# Patient Record
Sex: Male | Born: 1960
Health system: Southern US, Community
[De-identification: ages and names within clinical notes are randomized; demographics above are authoritative.]

## PROBLEM LIST (undated history)

## (undated) DIAGNOSIS — T7840XA Allergy, unspecified, initial encounter: Secondary | ICD-10-CM

## (undated) DIAGNOSIS — I10 Essential (primary) hypertension: Secondary | ICD-10-CM

## (undated) DIAGNOSIS — N200 Calculus of kidney: Secondary | ICD-10-CM

## (undated) HISTORY — DX: Allergy, unspecified, initial encounter: T78.40XA

## (undated) HISTORY — DX: Essential (primary) hypertension: I10

---

## 2005-10-31 ENCOUNTER — Emergency Department: Payer: Self-pay | Admitting: Internal Medicine

## 2005-10-31 ENCOUNTER — Other Ambulatory Visit: Payer: Self-pay

## 2011-05-22 ENCOUNTER — Other Ambulatory Visit: Payer: Self-pay | Admitting: Family Medicine

## 2011-05-22 ENCOUNTER — Other Ambulatory Visit: Payer: Self-pay | Admitting: Physician Assistant

## 2011-05-22 NOTE — Telephone Encounter (Signed)
done

## 2013-08-22 ENCOUNTER — Ambulatory Visit: Payer: Self-pay | Admitting: Emergency Medicine

## 2013-08-22 VITALS — BP 132/86 | HR 85 | Temp 97.4°F | Resp 16 | Ht 68.0 in | Wt 186.4 lb

## 2013-08-22 DIAGNOSIS — W57XXXA Bitten or stung by nonvenomous insect and other nonvenomous arthropods, initial encounter: Secondary | ICD-10-CM

## 2013-08-22 DIAGNOSIS — T148 Other injury of unspecified body region: Secondary | ICD-10-CM

## 2013-08-22 LAB — COMPREHENSIVE METABOLIC PANEL
ALBUMIN: 4.7 g/dL (ref 3.5–5.2)
ALK PHOS: 83 U/L (ref 39–117)
ALT: 18 U/L (ref 0–53)
AST: 21 U/L (ref 0–37)
BUN: 16 mg/dL (ref 6–23)
CO2: 26 mEq/L (ref 19–32)
Calcium: 9.5 mg/dL (ref 8.4–10.5)
Chloride: 104 mEq/L (ref 96–112)
Creat: 0.89 mg/dL (ref 0.50–1.35)
Glucose, Bld: 89 mg/dL (ref 70–99)
POTASSIUM: 4.6 meq/L (ref 3.5–5.3)
SODIUM: 139 meq/L (ref 135–145)
TOTAL PROTEIN: 6.9 g/dL (ref 6.0–8.3)
Total Bilirubin: 0.6 mg/dL (ref 0.2–1.2)

## 2013-08-22 LAB — POCT CBC
GRANULOCYTE PERCENT: 69.6 % (ref 37–80)
HCT, POC: 48.9 % (ref 43.5–53.7)
Hemoglobin: 16.6 g/dL (ref 14.1–18.1)
Lymph, poc: 1.6 (ref 0.6–3.4)
MCH, POC: 32 pg — AB (ref 27–31.2)
MCHC: 33.9 g/dL (ref 31.8–35.4)
MCV: 94.4 fL (ref 80–97)
MID (CBC): 0.4 (ref 0–0.9)
MPV: 7.5 fL (ref 0–99.8)
PLATELET COUNT, POC: 326 10*3/uL (ref 142–424)
POC Granulocyte: 4.7 (ref 2–6.9)
POC LYMPH PERCENT: 24.5 %L (ref 10–50)
POC MID %: 5.9 % (ref 0–12)
RBC: 5.18 M/uL (ref 4.69–6.13)
RDW, POC: 13.1 %
WBC: 6.7 10*3/uL (ref 4.6–10.2)

## 2013-08-22 MED ORDER — AMOXICILLIN 500 MG PO CAPS: ORAL_CAPSULE | ORAL | Status: DC

## 2013-08-22 NOTE — Progress Notes (Signed)
Subjective:  This chart was scribed for Theodore Greene,   by Stacy Gardner, [facility] Scribe. The patient was seen in room 5 and the patient's care was started at 11:50 AM.   Patient ID: Theodore Greene, male    DOB: 19-Nov-1960, 53 y.o.   MRN: [id number] Chief Complaint  Patient presents with  . Tick Removal    poss lime     HPI HPI Comments: Theodore Greene is a 53 y.o. male who arrives to the [facility] complaining of tick bite to his upper back that occurred two weeks ago. He had multiple tick bites. [place]  Pt removed the tick and brought it here today. Pt thinks the tick's head may still be lodged in his side.  He has heart palpations and reports this occurred the last time he contracted Lime Disease. Denies anxiety.  Pt has a rash to his upper back, joint pain, and overall not feeling well. Pt reports having nausea and vomiting with taking doxycycline.  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 He mentions when he scrapes his hands or arms he has "blood blisters". Pt wears gloves while working. His grandmother has similar skin issues. There are no active problems to display for this patient.  Past Medical History  Diagnosis Date  . Allergy   . Hypertension    History reviewed. No pertinent past surgical history. Allergies  Allergen Reactions  . Doxycycline    Prior to Admission medications   Not on File   History   Social History  . Marital Status: Married    Spouse Name: N/A    Number of Children: N/A  . Years of Education: N/A   Occupational History  . Not on file.   Social History Main Topics  . Smoking status: Never Smoker   . Smokeless tobacco: Not on file  . Alcohol Use: No  . Drug Use: No  . Sexual Activity: Not on file   Other Topics Concern  . Not on file   Social History Narrative  . No narrative on file    Review of Systems    Musculoskeletal: Positive for arthralgias.  Skin: Positive for color change, rash and wound.       Objective:   Physical Exam CONSTITUTIONAL: Well developed/well nourished HEAD: Normocephalic/atraumatic EYES: EOMI/PERRL ENMT: Mucous membranes moist NECK: supple no meningeal signs SPINE:entire spine nontender CV: S1/S2 noted, no murmurs/rubs/gallops noted LUNGS: Lungs are clear to auscultation bilaterally, no apparent distress ABDOMEN: soft, nontender, no rebound or guarding GU:no cva tenderness NEURO: Pt is awake/alert, moves all extremitiesx4 EXTREMITIES: pulses normal, full ROM SKIN: warm, color normal in the left flank area there are 3 separate bite-like areas. One has a retained 1 mm foreign body which was removed with a 20-gauge needle. There is 9 cm of surrounding redness to these bites PSYCH: no abnormalities of mood noted  No results found for this or any previous visit.  DIAGNOSTIC STUDIES: Oxygen Saturation is 98% on room air, normal by my interpretation.    COORDINATION OF CARE:  11:56 AM Discussed course of care with pt . Pt understands and agrees.  Results for orders placed in visit on 08/22/13  POCT CBC      Result Value Ref Range   WBC 6.7  4.6 - 10.2 K/uL   Lymph, poc 1.6  0.6 - 3.4   POC  LYMPH PERCENT 24.5  10 - 50 %L   MID (cbc) 0.4  0 - 0.9   POC MID % 5.9  0 - 12 %M   POC Granulocyte 4.7  2 - 6.9   Granulocyte percent 69.6  37 - 80 %G   RBC 5.18  4.69 - 6.13 M/uL   Hemoglobin 16.6  14.1 - 18.1 g/dL   HCT, POC 48.9  43.5 - 53.7 %   MCV 94.4  80 - 97 fL   MCH, POC 32.0 (*) 27 - 31.2 pg   MCHC 33.9  31.8 - 35.4 g/dL   RDW, POC 13.1     Platelet Count, POC 326  142 - 424 K/uL   MPV 7.5  0 - 99.8 fL          Assessment & Plan:  Patient here with suspected Lyme disease erythema chronicum migrans. Titers were done we'll treat with amoxicillin for 3 weeks he has had significant GI problems and rash when he took doxycycline in the past . The  patient has a history of lyme previously 4-5 years ago.I personally performed the services described in this documentation, which was scribed in my presence. The recorded information has been reviewed and is accurate.

## 2013-08-22 NOTE — Patient Instructions (Signed)
Lyme Disease  You may have been bitten by a tick and are to watch for the development of Lyme Disease. Lyme Disease is an infection that is caused by a bacteria The bacteria causing this disease is named Borreilia burgdorferi. If a tick is infected with this bacteria and then bites you, then Lyme Disease may occur. These ticks are carried by deer and rodents such as rabbits and mice and infest grassy as well as forested areas. Fortunately most tick bites do not cause Lyme Disease.   Lyme Disease is easier to prevent than to treat. First, covering your legs with clothing when walking in areas where ticks are possibly abundant will prevent their attachment because ticks tend to stay within inches of the ground. Second, using insecticides containing DEET can be applied on skin or clothing. Last, because it takes about 12 to 24 hours for the tick to transmit the disease after attachment to the human host, you should inspect your body for ticks twice a day when you are in areas where Lyme Disease is common. You must look thoroughly when searching for ticks. The Ixodes tick that carries Lyme Disease is very small. It is around the size of a sesame seed (picture of tick is not actual size). Removal is best done by grasping the tick by the head and pulling it out. Do not to squeeze the body of the tick. This could inject the infecting bacteria into the bite site. Wash the area of the bite with an antiseptic solution after removal.   Lyme Disease is a disease that may affect many body systems. Because of the small size of the biting tick, most people do not notice being bitten. The first sign of an infection is usually a round red rash that extends out from the center of the tick bite. The center of the lesion may be blood colored (hemorrhagic) or have tiny blisters (vesicular). Most lesions have bright red outer borders and partial central clearing. This rash may extend out many inches in diameter, and multiple lesions may  be present. Other symptoms such as fatigue, headaches, chills and fever, general achiness and swelling of lymph glands may also occur. If this first stage of the disease is left untreated, these symptoms may gradually resolve by themselves, or progressive symptoms may occur because of spread of infection to other areas of the body.   Follow up with your caregiver to have testing and treatment if you have a tick bite and you develop any of the above complaints. Your caregiver may recommend preventative (prophylactic) medications which kill bacteria (antibiotics). Once a diagnosis of Lyme Disease is made, antibiotic treatment is highly likely to cure the disease. Effective treatment of late stage Lyme Disease may require longer courses of antibiotic therapy.   MAKE SURE YOU:   · Understand these instructions.  · Will watch your condition.  · Will get help right away if you are not doing well or get worse.  Document Released: 06/21/2000 Document Revised: 06/07/2011 Document Reviewed: 08/23/2008  [employer]® Patient Information ©2014 [employer].

## 2013-08-23 LAB — ROCKY MTN SPOTTED FVR AB, IGM-BLOOD: ROCKY MTN SPOTTED FEVER, IGM: 0.3 IV

## 2013-08-23 LAB — B. BURGDORFI ANTIBODIES: B burgdorferi Ab IgG+IgM: 0.31 {ISR}

## 2015-08-05 ENCOUNTER — Ambulatory Visit (INDEPENDENT_AMBULATORY_CARE_PROVIDER_SITE_OTHER): Payer: Self-pay | Admitting: Emergency Medicine

## 2015-08-05 ENCOUNTER — Ambulatory Visit (INDEPENDENT_AMBULATORY_CARE_PROVIDER_SITE_OTHER): Payer: Self-pay

## 2015-08-05 VITALS — BP 168/102 | HR 92 | Temp 99.1°F | Ht 68.5 in | Wt 191.0 lb

## 2015-08-05 DIAGNOSIS — J0100 Acute maxillary sinusitis, unspecified: Secondary | ICD-10-CM

## 2015-08-05 DIAGNOSIS — IMO0001 Reserved for inherently not codable concepts without codable children: Secondary | ICD-10-CM

## 2015-08-05 DIAGNOSIS — R509 Fever, unspecified: Secondary | ICD-10-CM

## 2015-08-05 DIAGNOSIS — R042 Hemoptysis: Secondary | ICD-10-CM

## 2015-08-05 DIAGNOSIS — R030 Elevated blood-pressure reading, without diagnosis of hypertension: Secondary | ICD-10-CM

## 2015-08-05 LAB — POCT CBC
Granulocyte percent: 82.6 %G — AB (ref 37–80)
HCT, POC: 48.2 % (ref 43.5–53.7)
HEMOGLOBIN: 17.6 g/dL (ref 14.1–18.1)
LYMPH, POC: 1.3 (ref 0.6–3.4)
MCH, POC: 32.2 pg — AB (ref 27–31.2)
MCHC: 36.6 g/dL — AB (ref 31.8–35.4)
MCV: 88 fL (ref 80–97)
MID (cbc): 0.7 (ref 0–0.9)
MPV: 6.3 fL (ref 0–99.8)
POC Granulocyte: 9.7 — AB (ref 2–6.9)
POC LYMPH PERCENT: 11.5 %L (ref 10–50)
POC MID %: 5.9 %M (ref 0–12)
Platelet Count, POC: 278 10*3/uL (ref 142–424)
RBC: 5.48 M/uL (ref 4.69–6.13)
RDW, POC: 12.7 %
WBC: 11.7 10*3/uL — AB (ref 4.6–10.2)

## 2015-08-05 LAB — COMPLETE METABOLIC PANEL WITH GFR
ALBUMIN: 4.5 g/dL (ref 3.6–5.1)
ALK PHOS: 115 U/L (ref 40–115)
ALT: 16 U/L (ref 9–46)
AST: 18 U/L (ref 10–35)
BUN: 13 mg/dL (ref 7–25)
CO2: 25 mmol/L (ref 20–31)
CREATININE: 0.98 mg/dL (ref 0.70–1.33)
Calcium: 9.2 mg/dL (ref 8.6–10.3)
Chloride: 103 mmol/L (ref 98–110)
GFR, Est African American: >89 mL/min (ref >=60)
GFR, Est Non African American: 87 mL/min (ref >=60)
GLUCOSE: 82 mg/dL (ref 65–99)
POTASSIUM: 4.3 mmol/L (ref 3.5–5.3)
SODIUM: 139 mmol/L (ref 135–146)
TOTAL PROTEIN: 7.3 g/dL (ref 6.1–8.1)
Total Bilirubin: 0.9 mg/dL (ref 0.2–1.2)

## 2015-08-05 MED ORDER — AMOXICILLIN-POT CLAVULANATE 875-125 MG PO TABS: 1.0000 | ORAL_TABLET | Freq: Two times a day (BID) | ORAL | Status: DC

## 2015-08-05 MED ORDER — FLUTICASONE PROPIONATE 50 MCG/ACT NA SUSP: 2.0000 | Freq: Every day | NASAL | Status: DC

## 2015-08-05 MED ORDER — AMLODIPINE BESYLATE 5 MG PO TABS: 5.0000 mg | ORAL_TABLET | Freq: Every day | ORAL | Status: DC

## 2015-08-05 NOTE — Progress Notes (Addendum)
By signing my name below, I, Theodore Greene, attest that this documentation has been prepared under the direction and in the presence of Theodore Queen, MD.  Electronically Signed: Verlee Greene, Medical Scribe. 08/05/2015. 11:28 AM.  Chief Complaint:  Chief Complaint  Patient presents with  . Sinusitis    HPI: Theodore Greene is a 55 y.o. male who reports to [facility] today complaining of worsening sneezing and productive coughing with green, white and red sputum that began 2 weeks ago. Pt reports having a fever of 101 last night. Pts wife reports intermittent conjunctival icterus. [name] mentioned he could have a sick contact when his son picked up a viral infection 2 weeks ago. Pt reports smoking a pack a day, and has been smoking since he was 55 y/o.   Pt mentions hx of kidney stones.  Past Medical History  Diagnosis Date  . Allergy   . Hypertension    History reviewed. No pertinent past surgical history. Social History   Social History  . Marital Status: Married    Spouse Name: N/A  . Number of Children: N/A  . Years of Education: N/A   Social History Main Topics  . Smoking status: Current Some Day Smoker  . Smokeless tobacco: None  . Alcohol Use: No  . Drug Use: No  . Sexual Activity: Not Asked   Other Topics Concern  . None   Social History Narrative   Family History  Problem Relation Age of Onset  . Cancer Father    Allergies  Allergen Reactions  . Doxycycline    Prior to Admission medications   Medication Sig Start Date End Date Taking? Authorizing Provider  amoxicillin (AMOXIL) 500 MG capsule Take 1 pill  3 times a day for 3 weeks Patient not taking: Reported on 08/05/2015 08/22/13   Theodore Russian, MD     ROS: The patient denies, chills, night sweats, unintentional weight loss, chest pain, palpitations, wheezing, dyspnea on exertion, nausea, vomiting, abdominal pain, dysuria, hematuria, melena, numbness, weakness, or tingling.  All other systems have  been reviewed and were otherwise negative with the exception of those mentioned in the HPI and as above.    PHYSICAL EXAM: Filed Vitals:   08/05/15 1036  BP: 168/102  Pulse: 92  Temp: 99.1 F (37.3 C)   Body mass index is 28.62 kg/(m^2).   General: Alert. Pt appears ill but no distress HEENT:  Normocephalic, atraumatic, oropharynx patent. Eye: EOMI, [facility]. Some puffiness around eye. Hint of scleral icterus. Cardiovascular:  Regular rate and rhythm, no rubs murmurs or gallops.  No Carotid bruits, radial pulse intact. No pedal edema.  Respiratory: Clear to auscultation bilaterally.  No wheezes, rales, or rhonchi.  No cyanosis, no use of accessory musculature Abdominal: No organomegaly, abdomen is soft and non-tender, positive bowel sounds.  No masses. Musculoskeletal: Gait intact. No edema, tenderness Skin: No rashes. Neurologic: Facial musculature symmetric. Psychiatric: Patient acts appropriately throughout our interaction. Lymphatic: No cervical or submandibular lymphadenopathy  LABS: Results for orders placed or performed in visit on 08/05/15  POCT CBC  Result Value Ref Range   WBC 11.7 (A) 4.6 - 10.2 K/uL   Lymph, poc 1.3 0.6 - 3.4   POC LYMPH PERCENT 11.5 10 - 50 %L   MID (cbc) 0.7 0 - 0.9   POC MID % 5.9 0 - 12 %M   POC Granulocyte 9.7 (A) 2 - 6.9   Granulocyte percent 82.6 (A) 37 - 80 %G   RBC  5.48 4.69 - 6.13 M/uL   Hemoglobin 17.6 14.1 - 18.1 g/dL   HCT, POC 48.2 43.5 - 53.7 %   MCV 88.0 80 - 97 fL   MCH, POC 32.2 (A) 27 - 31.2 pg   MCHC 36.6 (A) 31.8 - 35.4 g/dL   RDW, POC 12.7 %   Platelet Count, POC 278 142 - 424 K/uL   MPV 6.3 0 - 99.8 fL   EKG/XRAY:   Primary read interpreted by Dr. Everlene Farrier at [facility].  Dg Sinus 1-2 Views  08/05/2015  CLINICAL DATA:  Sinus pressure and drainage EXAM: PARANASAL SINUSES - 1-2 VIEW COMPARISON:  None. FINDINGS: A single Waters view of the paranasal sinuses shows a rounded opacity in the floor of the right maxillary sinus most  consistent with retention cyst. No definite mucosal thickening or air-fluid level is seen on the single Waters view obtained. No bony abnormality is noted. IMPRESSION: Probable retention cyst in the floor of the right maxillary sinus. Consider CT maxillary sinus study if further assessment is warranted. Electronically Signed   By: Theodore Greene M.D.   On: 08/05/2015 12:08   Dg Chest 2 View  08/05/2015  CLINICAL DATA:  Worsening productive cough and sneezing.  Fever. EXAM: CHEST  2 VIEW COMPARISON:  None. FINDINGS: Normal sized heart. Clear lungs. Mild central peribronchial thickening. Unremarkable bones. IMPRESSION: Mild bronchitic changes. Electronically Signed   By: Theodore Greene M.D.   On: 08/05/2015 12:08    ASSESSMENT/PLAN: Patient showed evidence of bronchitis as well as a rounded opacity in the floor of the right maxillary sinus. Patient is private pay. We'll plan recheck in 1-2 weeks and decision made at that time regarding CT of the maxillary sinus. Patient will be treated with Augmentin twice a day. He will be on Mucinex twice a day. He was started on Norvasc 5 mg daily for blood pressure. He was encouraged to stop smoking. He was advised he needed to follow-up in 1-2 weeks regarding his blood pressure.I personally performed the services described in this documentation, which was scribed in my presence. The recorded information has been reviewed and is accurate.   Gross sideeffects, risk and benefits, and alternatives of medications d/w patient. Patient is aware that all medications have potential sideeffects and we are unable to predict every sideeffect or drug-drug interaction that may occur.  Theodore Queen MD 08/05/2015 11:27 AM

## 2015-08-05 NOTE — Patient Instructions (Addendum)
IF you received an x-ray today, you will receive an invoice from [facility]. Please contact [facility] at [phone] with questions or concerns regarding your invoice.   IF you received labwork today, you will receive an invoice from [employer]. Please contact [facility] at [phone] with questions or concerns regarding your invoice.   Our billing staff will not be able to assist you with questions regarding bills from these companies.  You will be contacted with the lab results as soon as they are available. The fastest way to get your results is to activate your My Chart account. Instructions are located on the last page of this paperwork. If you have not heard from [place] regarding the results in 2 weeks, please contact this office.    DASH Eating Plan DASH stands for "Dietary Approaches to Stop Hypertension." The DASH eating plan is a healthy eating plan that has been shown to reduce high blood pressure (hypertension). Additional health benefits may include reducing the risk of type 2 diabetes mellitus, heart disease, and stroke. The DASH eating plan may also help with weight loss. WHAT DO I NEED TO KNOW ABOUT THE DASH EATING PLAN? For the DASH eating plan, you will follow these general guidelines:  Choose foods with a percent daily value for sodium of less than 5% (as listed on the food label).  Use salt-free seasonings or herbs instead of table salt or sea salt.  Check with your health care provider or pharmacist before using salt substitutes.  Eat lower-sodium products, often labeled as "lower sodium" or "no salt added."  Eat fresh foods.  Eat more vegetables, fruits, and low-fat dairy products.  Choose whole grains. Look for the word "whole" as the first word in the ingredient list.  Choose fish and skinless chicken or [place] more often than red meat. Limit fish, poultry, and meat to 6 oz (170 g) each day.  Limit sweets,  desserts, sugars, and sugary drinks.  Choose heart-healthy fats.  Limit cheese to 1 oz (28 g) per day.  Eat more home-cooked food and less restaurant, buffet, and fast food.  Limit fried foods.  Cook foods using methods other than frying.  Limit canned vegetables. If you do use them, rinse them well to decrease the sodium.  When eating at a restaurant, ask that your food be prepared with less salt, or no salt if possible. WHAT FOODS CAN I EAT? Seek help from a dietitian for individual calorie needs. Grains Whole grain or whole wheat bread. Brown rice. Whole grain or whole wheat pasta. Quinoa, bulgur, and whole grain cereals. Low-sodium cereals. Corn or whole wheat flour tortillas. Whole grain cornbread. Whole grain crackers. Low-sodium crackers. Vegetables Fresh or frozen vegetables (raw, steamed, roasted, or grilled). Low-sodium or reduced-sodium tomato and vegetable juices. Low-sodium or reduced-sodium tomato sauce and paste. Low-sodium or reduced-sodium canned vegetables.  Fruits All fresh, canned (in natural juice), or frozen fruits. Meat and Other Protein Products Ground beef (85% or leaner), grass-fed beef, or beef trimmed of fat. Skinless chicken or [place]. Ground chicken or [place]. Pork trimmed of fat. All fish and seafood. Eggs. Dried beans, peas, or lentils. Unsalted nuts and seeds. Unsalted canned beans. Dairy Low-fat dairy products, such as skim or 1% milk, 2% or reduced-fat cheeses, low-fat ricotta or cottage cheese, or plain low-fat yogurt. Low-sodium or reduced-sodium cheeses. Fats and Oils Tub margarines without trans fats. Light or reduced-fat mayonnaise and salad dressings (reduced sodium). Avocado. Safflower, olive, or canola oils.  Natural peanut or almond butter. Other Unsalted popcorn and pretzels. The items listed above may not be a complete list of recommended foods or beverages. Contact your dietitian for more options. WHAT FOODS ARE NOT  RECOMMENDED? Grains White bread. White pasta. White rice. Refined cornbread. Bagels and croissants. Crackers that contain trans fat. Vegetables Creamed or fried vegetables. Vegetables in a cheese sauce. Regular canned vegetables. Regular canned tomato sauce and paste. Regular tomato and vegetable juices. Fruits Dried fruits. Canned fruit in light or heavy syrup. Fruit juice. Meat and Other Protein Products Fatty cuts of meat. Ribs, chicken wings, bacon, sausage, bologna, salami, chitterlings, fatback, hot dogs, bratwurst, and packaged luncheon meats. Salted nuts and seeds. Canned beans with salt. Dairy Whole or 2% milk, cream, half-and-half, and cream cheese. Whole-fat or sweetened yogurt. Full-fat cheeses or blue cheese. Nondairy creamers and whipped toppings. Processed cheese, cheese spreads, or cheese curds. Condiments Onion and garlic salt, seasoned salt, table salt, and sea salt. Canned and packaged gravies. Worcestershire sauce. Tartar sauce. Barbecue sauce. Teriyaki sauce. Soy sauce, including reduced sodium. Steak sauce. Fish sauce. Oyster sauce. Cocktail sauce. Horseradish. Ketchup and mustard. Meat flavorings and tenderizers. Bouillon cubes. Hot sauce. Tabasco sauce. Marinades. Taco seasonings. Relishes. Fats and Oils Butter, stick margarine, lard, shortening, ghee, and bacon fat. Coconut, palm kernel, or palm oils. Regular salad dressings. Other Pickles and olives. Salted popcorn and pretzels. The items listed above may not be a complete list of foods and beverages to avoid. Contact your dietitian for more information. WHERE CAN I FIND MORE INFORMATION? National Heart, Lung, and Blood Institute: travelstabloid.com   This information is not intended to replace advice given to you by your health care provider. Make sure you discuss any questions you have with your health care provider.   Document Released: 03/04/2011 Document Revised: 04/05/2014  Document Reviewed: 01/17/2013 Elsevier Interactive Patient Education 2016 [employer]. Smoking Cessation, Tips for Success If you are ready to quit smoking, congratulations! You have chosen to help yourself be healthier. Cigarettes bring nicotine, tar, carbon monoxide, and other irritants into your body. Your lungs, heart, and blood vessels will be able to work better without these poisons. There are many different ways to quit smoking. Nicotine gum, nicotine patches, a nicotine inhaler, or nicotine nasal spray can help with physical craving. Hypnosis, support groups, and medicines help break the habit of smoking. WHAT THINGS CAN I DO TO MAKE QUITTING EASIER?  Here are some tips to help you quit for good:  Pick a date when you will quit smoking completely. Tell all of your friends and family about your plan to quit on that date.  Do not try to slowly cut down on the number of cigarettes you are smoking. Pick a quit date and quit smoking completely starting on that day.  Throw away all cigarettes.   Clean and remove all ashtrays from your home, work, and car.  On a card, write down your reasons for quitting. Carry the card with you and read it when you get the urge to smoke.  Cleanse your body of nicotine. Drink enough water and fluids to keep your urine clear or pale yellow. Do this after quitting to flush the nicotine from your body.  Learn to predict your moods. Do not let a bad situation be your excuse to have a cigarette. Some situations in your life might tempt you into wanting a cigarette.  Never have "just one" cigarette. It leads to wanting another and another. Remind yourself of your  decision to quit.  Change habits associated with smoking. If you smoked while driving or when feeling stressed, try other activities to replace smoking. Stand up when drinking your coffee. Brush your teeth after eating. Sit in a different chair when you read the paper. Avoid alcohol while trying to  quit, and try to drink fewer caffeinated beverages. Alcohol and caffeine may urge you to smoke.  Avoid foods and drinks that can trigger a desire to smoke, such as sugary or spicy foods and alcohol.  Ask people who smoke not to smoke around you.  Have something planned to do right after eating or having a cup of coffee. For example, plan to take a walk or exercise.  Try a relaxation exercise to calm you down and decrease your stress. Remember, you may be tense and nervous for the first 2 weeks after you quit, but this will pass.  Find new activities to keep your hands busy. Play with a pen, coin, or rubber band. Doodle or draw things on paper.  Brush your teeth right after eating. This will help cut down on the craving for the taste of tobacco after meals. You can also try mouthwash.   Use oral substitutes in place of cigarettes. Try using lemon drops, carrots, cinnamon sticks, or chewing gum. Keep them handy so they are available when you have the urge to smoke.  When you have the urge to smoke, try deep breathing.  Designate your home as a nonsmoking area.  If you are a heavy smoker, ask your health care provider about a prescription for nicotine chewing gum. It can ease your withdrawal from nicotine.  Reward yourself. Set aside the cigarette money you save and buy yourself something nice.  Look for support from others. Join a support group or smoking cessation program. Ask someone at home or at work to help you with your plan to quit smoking.  Always ask yourself, "Do I need this cigarette or is this just a reflex?" Tell yourself, "Today, I choose not to smoke," or "I do not want to smoke." You are reminding yourself of your decision to quit.  Do not replace cigarette smoking with electronic cigarettes (commonly called e-cigarettes). The safety of e-cigarettes is unknown, and some may contain harmful chemicals.  If you relapse, do not give up! Plan ahead and think about what you  will do the next time you get the urge to smoke. HOW WILL I FEEL WHEN I QUIT SMOKING? You may have symptoms of withdrawal because your body is used to nicotine (the addictive substance in cigarettes). You may crave cigarettes, be irritable, feel very hungry, cough often, get headaches, or have difficulty concentrating. The withdrawal symptoms are only temporary. They are strongest when you first quit but will go away within 10-14 days. When withdrawal symptoms occur, stay in control. Think about your reasons for quitting. Remind yourself that these are signs that your body is healing and getting used to being without cigarettes. Remember that withdrawal symptoms are easier to treat than the major diseases that smoking can cause.  Even after the withdrawal is over, expect periodic urges to smoke. However, these cravings are generally short lived and will go away whether you smoke or not. Do not smoke! WHAT RESOURCES ARE AVAILABLE TO HELP ME QUIT SMOKING? Your health care provider can direct you to community resources or hospitals for support, which may include:  Group support.  Education.  Hypnosis.  Therapy.   This information is not intended to  replace advice given to you by your health care provider. Make sure you discuss any questions you have with your health care provider.   Document Released: 12/12/2003 Document Revised: 04/05/2014 Document Reviewed: 08/31/2012 Elsevier Interactive Patient Education 2016 Elsevier Inc. Sinusitis, Adult Sinusitis is redness, soreness, and inflammation of the paranasal sinuses. Paranasal sinuses are air pockets within the bones of your face. They are located beneath your eyes, in the middle of your forehead, and above your eyes. In healthy paranasal sinuses, mucus is able to drain out, and air is able to circulate through them by way of your nose. However, when your paranasal sinuses are inflamed, mucus and air can become trapped. This can allow bacteria and  other germs to grow and cause infection. Sinusitis can develop quickly and last only a short time (acute) or continue over a long period (chronic). Sinusitis that lasts for more than 12 weeks is considered chronic. CAUSES Causes of sinusitis include:  Allergies.  Structural abnormalities, such as displacement of the cartilage that separates your nostrils (deviated septum), which can decrease the air flow through your nose and sinuses and affect sinus drainage.  Functional abnormalities, such as when the small hairs (cilia) that line your sinuses and help remove mucus do not work properly or are not present. SIGNS AND SYMPTOMS Symptoms of acute and chronic sinusitis are the same. The primary symptoms are pain and pressure around the affected sinuses. Other symptoms include:  Upper toothache.  Earache.  Headache.  Bad breath.  Decreased sense of smell and taste.  A cough, which worsens when you are lying flat.  Fatigue.  Fever.  Thick drainage from your nose, which often is green and may contain pus (purulent).  Swelling and warmth over the affected sinuses. DIAGNOSIS Your health care provider will perform a physical exam. During your exam, your health care provider may perform any of the following to help determine if you have acute sinusitis or chronic sinusitis:  Look in your nose for signs of abnormal growths in your nostrils (nasal polyps).  Tap over the affected sinus to check for signs of infection.  View the inside of your sinuses using an imaging device that has a light attached (endoscope). If your health care provider suspects that you have chronic sinusitis, one or more of the following tests may be recommended:  Allergy tests.  Nasal culture. A sample of mucus is taken from your nose, sent to a lab, and screened for bacteria.  Nasal cytology. A sample of mucus is taken from your nose and examined by your health care provider to determine if your sinusitis is  related to an allergy. TREATMENT Most cases of acute sinusitis are related to a viral infection and will resolve on their own within 10 days. Sometimes, medicines are prescribed to help relieve symptoms of both acute and chronic sinusitis. These may include pain medicines, decongestants, nasal steroid sprays, or saline sprays. However, for sinusitis related to a bacterial infection, your health care provider will prescribe antibiotic medicines. These are medicines that will help kill the bacteria causing the infection. Rarely, sinusitis is caused by a fungal infection. In these cases, your health care provider will prescribe antifungal medicine. For some cases of chronic sinusitis, surgery is needed. Generally, these are cases in which sinusitis recurs more than 3 times per year, despite other treatments. HOME CARE INSTRUCTIONS  Drink plenty of water. Water helps thin the mucus so your sinuses can drain more easily.  Use a humidifier.  Inhale steam  3-4 times a day (for example, sit in the bathroom with the shower running).  Apply a warm, moist washcloth to your face 3-4 times a day, or as directed by your health care provider.  Use saline nasal sprays to help moisten and clean your sinuses.  Take medicines only as directed by your health care provider.  If you were prescribed either an antibiotic or antifungal medicine, finish it all even if you start to feel better. SEEK IMMEDIATE MEDICAL CARE IF:  You have increasing pain or severe headaches.  You have nausea, vomiting, or drowsiness.  You have swelling around your face.  You have vision problems.  You have a stiff neck.  You have difficulty breathing.   This information is not intended to replace advice given to you by your health care provider. Make sure you discuss any questions you have with your health care provider.   Document Released: 03/15/2005 Document Revised: 04/05/2014 Document Reviewed: 03/30/2011 Elsevier  Interactive Patient Education 2016 Elsevier Inc. Acute Bronchitis Bronchitis is inflammation of the airways that extend from the windpipe into the lungs (bronchi). The inflammation often causes mucus to develop. This leads to a cough, which is the most common symptom of bronchitis.  In acute bronchitis, the condition usually develops suddenly and goes away over time, usually in a couple weeks. Smoking, allergies, and asthma can make bronchitis worse. Repeated episodes of bronchitis may cause further lung problems.  CAUSES Acute bronchitis is most often caused by the same virus that causes a cold. The virus can spread from person to person (contagious) through coughing, sneezing, and touching contaminated objects. SIGNS AND SYMPTOMS   Cough.   Fever.   Coughing up mucus.   Body aches.   Chest congestion.   Chills.   Shortness of breath.   Sore throat.  DIAGNOSIS  Acute bronchitis is usually diagnosed through a physical exam. Your health care provider will also ask you questions about your medical history. Tests, such as chest X-rays, are sometimes done to rule out other conditions.  TREATMENT  Acute bronchitis usually goes away in a couple weeks. Oftentimes, no medical treatment is necessary. Medicines are sometimes given for relief of fever or cough. Antibiotic medicines are usually not needed but may be prescribed in certain situations. In some cases, an inhaler may be recommended to help reduce shortness of breath and control the cough. A cool mist vaporizer may also be used to help thin bronchial secretions and make it easier to clear the chest.  HOME CARE INSTRUCTIONS  Get plenty of rest.   Drink enough fluids to keep your urine clear or pale yellow (unless you have a medical condition that requires fluid restriction). Increasing fluids may help thin your respiratory secretions (sputum) and reduce chest congestion, and it will prevent dehydration.   Take medicines only  as directed by your health care provider.  If you were prescribed an antibiotic medicine, finish it all even if you start to feel better.  Avoid smoking and secondhand smoke. Exposure to cigarette smoke or irritating chemicals will make bronchitis worse. If you are a smoker, consider using nicotine gum or skin patches to help control withdrawal symptoms. Quitting smoking will help your lungs heal faster.   Reduce the chances of another bout of acute bronchitis by washing your hands frequently, avoiding people with cold symptoms, and trying not to touch your hands to your mouth, nose, or eyes.   Keep all follow-up visits as directed by your health care provider.  SEEK MEDICAL CARE IF: Your symptoms do not improve after 1 week of treatment.  SEEK IMMEDIATE MEDICAL CARE IF:  You develop an increased fever or chills.   You have chest pain.   You have severe shortness of breath.  You have bloody sputum.   You develop dehydration.  You faint or repeatedly feel like you are going to pass out.  You develop repeated vomiting.  You develop a severe headache. MAKE SURE YOU:   Understand these instructions.  Will watch your condition.  Will get help right away if you are not doing well or get worse.   This information is not intended to replace advice given to you by your health care provider. Make sure you discuss any questions you have with your health care provider.   Document Released: 04/22/2004 Document Revised: 04/05/2014 Document Reviewed: 09/05/2012 Elsevier Interactive Patient Education [employer].

## 2015-10-20 ENCOUNTER — Emergency Department (HOSPITAL_COMMUNITY): Payer: Self-pay

## 2015-10-20 ENCOUNTER — Encounter (HOSPITAL_COMMUNITY): Payer: Self-pay | Admitting: Emergency Medicine

## 2015-10-20 ENCOUNTER — Inpatient Hospital Stay (HOSPITAL_COMMUNITY)
Admission: EM | Admit: 2015-10-20 | Discharge: 2015-10-27 | DRG: 392 | Disposition: A | Discharge status: Home / Self Care | Payer: Self-pay | Attending: General Surgery | Admitting: General Surgery

## 2015-10-20 DIAGNOSIS — K5720 Diverticulitis of large intestine with perforation and abscess without bleeding: Principal | ICD-10-CM | POA: Diagnosis present

## 2015-10-20 DIAGNOSIS — I10 Essential (primary) hypertension: Secondary | ICD-10-CM | POA: Diagnosis present

## 2015-10-20 DIAGNOSIS — Z809 Family history of malignant neoplasm, unspecified: Secondary | ICD-10-CM

## 2015-10-20 DIAGNOSIS — K5792 Diverticulitis of intestine, part unspecified, without perforation or abscess without bleeding: Secondary | ICD-10-CM | POA: Diagnosis present

## 2015-10-20 DIAGNOSIS — Z881 Allergy status to other antibiotic agents status: Secondary | ICD-10-CM

## 2015-10-20 DIAGNOSIS — F17200 Nicotine dependence, unspecified, uncomplicated: Secondary | ICD-10-CM | POA: Diagnosis present

## 2015-10-20 DIAGNOSIS — Z79899 Other long term (current) drug therapy: Secondary | ICD-10-CM

## 2015-10-20 DIAGNOSIS — K5700 Diverticulitis of small intestine with perforation and abscess without bleeding: Secondary | ICD-10-CM

## 2015-10-20 DIAGNOSIS — Z7951 Long term (current) use of inhaled steroids: Secondary | ICD-10-CM

## 2015-10-20 DIAGNOSIS — K5780 Diverticulitis of intestine, part unspecified, with perforation and abscess without bleeding: Secondary | ICD-10-CM

## 2015-10-20 HISTORY — DX: Calculus of kidney: N20.0

## 2015-10-20 LAB — CBC
HCT: 51.6 % (ref 39.0–52.0)
HEMOGLOBIN: 17.6 g/dL — AB (ref 13.0–17.0)
MCH: 30.9 pg (ref 26.0–34.0)
MCHC: 34.1 g/dL (ref 30.0–36.0)
MCV: 90.5 fL (ref 78.0–100.0)
PLATELETS: 274 10*3/uL (ref 150–400)
RBC: 5.7 MIL/uL (ref 4.22–5.81)
RDW: 12.4 % (ref 11.5–15.5)
WBC: 16.6 10*3/uL — ABNORMAL HIGH (ref 4.0–10.5)

## 2015-10-20 LAB — URINALYSIS, ROUTINE W REFLEX MICROSCOPIC
Glucose, UA: NEGATIVE mg/dL
KETONES UR: 40 mg/dL — AB
Leukocytes, UA: NEGATIVE
NITRITE: NEGATIVE
PROTEIN: 30 mg/dL — AB
Specific Gravity, Urine: 1.023 (ref 1.005–1.030)
pH: 5.5 (ref 5.0–8.0)

## 2015-10-20 LAB — COMPREHENSIVE METABOLIC PANEL
ALBUMIN: 4.7 g/dL (ref 3.5–5.0)
ALK PHOS: 102 U/L (ref 38–126)
ALT: 15 U/L — AB (ref 17–63)
ANION GAP: 10 (ref 5–15)
AST: 18 U/L (ref 15–41)
BILIRUBIN TOTAL: 2.2 mg/dL — AB (ref 0.3–1.2)
BUN: 12 mg/dL (ref 6–20)
CALCIUM: 9.4 mg/dL (ref 8.9–10.3)
CO2: 25 mmol/L (ref 22–32)
CREATININE: 1 mg/dL (ref 0.61–1.24)
Chloride: 101 mmol/L (ref 101–111)
GFR calc Af Amer: >60 mL/min (ref >60)
GFR calc non Af Amer: >60 mL/min (ref >60)
GLUCOSE: 94 mg/dL (ref 65–99)
Potassium: 3.8 mmol/L (ref 3.5–5.1)
Sodium: 136 mmol/L (ref 135–145)
TOTAL PROTEIN: 8.5 g/dL — AB (ref 6.5–8.1)

## 2015-10-20 LAB — URINE MICROSCOPIC-ADD ON

## 2015-10-20 LAB — DIFFERENTIAL
BASOS ABS: 0 10*3/uL (ref 0.0–0.1)
BASOS PCT: 0 %
EOS ABS: 0 10*3/uL (ref 0.0–0.7)
EOS PCT: 0 %
Lymphocytes Relative: 9 %
Lymphs Abs: 1.6 10*3/uL (ref 0.7–4.0)
Monocytes Absolute: 1 10*3/uL (ref 0.1–1.0)
Monocytes Relative: 6 %
NEUTROS PCT: 85 %
Neutro Abs: 14.6 10*3/uL — ABNORMAL HIGH (ref 1.7–7.7)

## 2015-10-20 LAB — LIPASE, BLOOD: Lipase: 17 U/L (ref 11–51)

## 2015-10-20 MED ORDER — SODIUM CHLORIDE 0.9 % IV SOLN
Freq: Once | INTRAVENOUS | Status: AC
  Administered 2015-10-20: 23:00:00 via INTRAVENOUS

## 2015-10-20 MED ORDER — FENTANYL CITRATE (PF) 100 MCG/2ML IJ SOLN
100.0000 ug | Freq: Once | INTRAMUSCULAR | Status: AC
  Administered 2015-10-20: 100 ug via INTRAVENOUS
  Filled 2015-10-20: qty 2

## 2015-10-20 MED ORDER — IOPAMIDOL (ISOVUE-300) INJECTION 61%
100.0000 mL | Freq: Once | INTRAVENOUS | Status: AC | PRN
  Administered 2015-10-20: 100 mL via INTRAVENOUS

## 2015-10-20 MED ORDER — ONDANSETRON HCL 4 MG/2ML IJ SOLN
4.0000 mg | Freq: Once | INTRAMUSCULAR | Status: AC
  Administered 2015-10-20: 4 mg via INTRAVENOUS
  Filled 2015-10-20: qty 2

## 2015-10-20 NOTE — ED Triage Notes (Signed)
Pt c/o LLQ abdominal pain since yesterday with intermittent fever and nausea. Pt also c/o urinary retention but sts he's been able to go the past couple of hours. A&Ox4 and ambulatory. Denies flank pain, burning with urination.

## 2015-10-20 NOTE — ED Notes (Signed)
MD at bedside. 

## 2015-10-20 NOTE — ED Provider Notes (Addendum)
[facility] DEPT Provider Note   CSN: [id number] Arrival date & time: 10/20/15  1626  First Provider Contact: 11:07 PM   By signing my name below, I, Jasmyn B. Alexander, attest that this documentation has been prepared under the direction and in the presence of Shanon Rosser, MD.  Electronically Signed: Tedra Coupe. Sheppard Coil, ED Scribe. 10/20/15. 11:16 PM.     History   Chief Complaint Chief Complaint  Patient presents with  . Abdominal Pain    HPI HPI Comments: Theodore Greene is a 55 y.o. male who presents to the Emergency Department complaining of gradual onset, moderate to severe LLQ abdominal pain x 1 day. Pain is described as dull with a sharp component. It is worse with movement or palpation or attempted bowel movement.  He has associated constipation and fever (TMAX 102.3). Pt has taken Liquid Motrin and Excedrin for fever with some relief. Per wife, he has also received a 100mg  of Colace. He has a history of diverticulitis. He has had nausea but no vomiting.  The history is provided by the patient and the spouse. No language interpreter was used.   Past Medical History:  Diagnosis Date  . Allergy   . Hypertension     There are no active problems to display for this patient.   History reviewed. No pertinent surgical history.   Home Medications    Prior to Admission medications   Medication Sig Start Date End Date Taking? Authorizing Provider  amLODipine (NORVASC) 5 MG tablet Take 1 tablet (5 mg total) by mouth daily. 08/05/15  Yes Darlyne Russian, MD  aspirin-acetaminophen-caffeine (EXCEDRIN MIGRAINE) [phone] MG tablet Take 1 tablet by mouth every 6 (six) hours as needed for headache.   Yes Historical Provider, MD  docusate sodium (COLACE) 100 MG capsule Take 100 mg by mouth daily as needed for mild constipation.   Yes Historical Provider, MD  fluticasone (FLONASE) 50 MCG/ACT nasal spray Place 2 sprays into both nostrils daily. 08/05/15  Yes Darlyne Russian, MD    ibuprofen (ADVIL,MOTRIN) 200 MG tablet Take 400 mg by mouth every 6 (six) hours as needed for moderate pain.   Yes Historical Provider, MD  amoxicillin (AMOXIL) 500 MG capsule Take 1 pill  3 times a day for 3 weeks Patient not taking: Reported on 08/05/2015 08/22/13   Darlyne Russian, MD  amoxicillin-clavulanate (AUGMENTIN) 875-125 MG tablet Take 1 tablet by mouth 2 (two) times daily. Patient not taking: Reported on 10/20/2015 08/05/15   Darlyne Russian, MD    Family History Family History  Problem Relation Age of Onset  . Cancer Father     Social History Social History  Substance Use Topics  . Smoking status: Current Some Day Smoker  . Smokeless tobacco: Not on file  . Alcohol use No     Allergies   Doxycycline   Review of Systems Review of Systems  Physical Exam Updated Vital Signs BP 152/94 (BP Location: Right Arm)   Pulse 100   Temp 98.1 F (36.7 C) (Oral)   Resp 19   Ht 5\' 8"  (1.727 m)   Wt 186 lb (84.4 kg)   SpO2 97%   BMI 28.28 kg/m   Physical Exam General: Well-developed, well-nourished male in no acute distress; appearance consistent with age of record HENT: normocephalic; atraumatic Eyes: pupils equal, round and reactive to light; extraocular muscles intact Neck: supple Heart: regular rate and rhythm Lungs: clear to auscultation bilaterally Abdomen: soft; mildly distended; LLQ tenderness; no masses or  hepatosplenomegaly; bowel sounds hypoactive Extremities: No deformity; full range of motion; pulses normal Neurologic: Awake, alert and oriented; motor function intact in all extremities and symmetric; no facial droop Skin: Warm and dry Psychiatric: Normal mood and affect  ED Treatments / Results   Nursing notes and vitals signs, including pulse oximetry, reviewed.  Summary of this visit's results, reviewed by myself:  Labs:  Results for orders placed or performed during the hospital encounter of 10/20/15 (from the past 24 hour(s))  Urinalysis, Routine w  reflex microscopic     Status: Abnormal   Collection Time: 10/20/15  5:17 PM  Result Value Ref Range   Color, Urine ORANGE (A) YELLOW   APPearance CLEAR CLEAR   Specific Gravity, Urine 1.023 1.005 - 1.030   pH 5.5 5.0 - 8.0   Glucose, UA NEGATIVE NEGATIVE mg/dL   Hgb urine dipstick MODERATE (A) NEGATIVE   Bilirubin Urine SMALL (A) NEGATIVE   Ketones, ur 40 (A) NEGATIVE mg/dL   Protein, ur 30 (A) NEGATIVE mg/dL   Nitrite NEGATIVE NEGATIVE   Leukocytes, UA NEGATIVE NEGATIVE  Urine microscopic-add on     Status: Abnormal   Collection Time: 10/20/15  5:17 PM  Result Value Ref Range   Squamous Epithelial / LPF 0-5 (A) NONE SEEN   WBC, UA 0-5 0 - 5 WBC/hpf   RBC / HPF 6-30 0 - 5 RBC/hpf   Bacteria, UA RARE (A) NONE SEEN   Casts HYALINE CASTS (A) NEGATIVE   Urine-Other MUCOUS PRESENT   Lipase, blood     Status: None   Collection Time: 10/20/15  5:57 PM  Result Value Ref Range   Lipase 17 11 - 51 U/L  Comprehensive metabolic panel     Status: Abnormal   Collection Time: 10/20/15  5:57 PM  Result Value Ref Range   Sodium 136 135 - 145 mmol/L   Potassium 3.8 3.5 - 5.1 mmol/L   Chloride 101 101 - 111 mmol/L   CO2 25 22 - 32 mmol/L   Glucose, Bld 94 65 - 99 mg/dL   BUN 12 6 - 20 mg/dL   Creatinine, Ser 1.00 0.61 - 1.24 mg/dL   Calcium 9.4 8.9 - 10.3 mg/dL   Total Protein 8.5 (H) 6.5 - 8.1 g/dL   Albumin 4.7 3.5 - 5.0 g/dL   AST 18 15 - 41 U/L   ALT 15 (L) 17 - 63 U/L   Alkaline Phosphatase 102 38 - 126 U/L   Total Bilirubin 2.2 (H) 0.3 - 1.2 mg/dL   GFR calc non Af Amer >60 >60 mL/min   GFR calc Af Amer >60 >60 mL/min   Anion gap 10 5 - 15  CBC     Status: Abnormal   Collection Time: 10/20/15  5:57 PM  Result Value Ref Range   WBC 16.6 (H) 4.0 - 10.5 K/uL   RBC 5.70 4.22 - 5.81 MIL/uL   Hemoglobin 17.6 (H) 13.0 - 17.0 g/dL   HCT 51.6 39.0 - 52.0 %   MCV 90.5 78.0 - 100.0 fL   MCH 30.9 26.0 - 34.0 pg   MCHC 34.1 30.0 - 36.0 g/dL   RDW 12.4 11.5 - 15.5 %   Platelets 274  150 - 400 K/uL  Differential     Status: Abnormal   Collection Time: 10/20/15  5:57 PM  Result Value Ref Range   Neutrophils Relative % 85 %   Neutro Abs 14.6 (H) 1.7 - 7.7 K/uL   Lymphocytes Relative 9 %  Lymphs Abs 1.6 0.7 - 4.0 K/uL   Monocytes Relative 6 %   Monocytes Absolute 1.0 0.1 - 1.0 K/uL   Eosinophils Relative 0 %   Eosinophils Absolute 0.0 0.0 - 0.7 K/uL   Basophils Relative 0 %   Basophils Absolute 0.0 0.0 - 0.1 K/uL    Imaging Studies: Ct Abdomen Pelvis W Contrast  Result Date: 10/21/2015 CLINICAL DATA:  Left lower quadrant abdominal pain. Intermittent fever and nausea. History of hypertension and renal stones. EXAM: CT ABDOMEN AND PELVIS WITH CONTRAST TECHNIQUE: Multidetector CT imaging of the abdomen and pelvis was performed using the standard protocol following bolus administration of intravenous contrast. CONTRAST:  111mL ISOVUE-300 IOPAMIDOL (ISOVUE-300) INJECTION 61% COMPARISON:  None. FINDINGS: Lower chest: Limited visualization of the lower thorax is negative for focal airspace opacity or pleural effusion. Normal heart size.  No pericardial effusion. Hepatobiliary: Normal hepatic contour. Punctate (approximately 0.8 cm) hypo attenuating lesion within the dome of the left lobe of the liver (image 17, series 2) as well as an approximately 3 mm hypo attenuating lesion within the caudal aspect of the lateral segment of the left lobe of the liver (image 22, series 2) are both too small to accurately characterize though favored to represent hepatic cysts. Normal appearance of the gallbladder given degree distention. No radiopaque gallstones. No intra or extrahepatic biliary duct dilatation. No ascites. Pancreas: Normal appearance of the pancreas Spleen:  Normal appearance of the spleen Adrenals/Urinary Tract: There is symmetric enhancement and excretion of the bilateral kidneys. There are 2 punctate nonobstructing stones within the inferior pole of the left kidney with dominant  stone measuring 0.9 cm in greatest short axis diameter. No evidence of right-sided nephrolithiasis. No discrete renal lesions. No urinary obstruction. Normal appearance of the bilateral adrenal glands. Normal appearance of the urinary bladder given degree distention Stomach/Bowel: Extensive colonic diverticulosis with short segment wall thickening involving the sigmoid colon within the midline of the lower pelvis. This finding is associated with adjacent ill-defined mesenteric stranding and scattered foci of adjacent pneumoperitoneum which extend along the retroperitoneum to the level of the left upper abdominal quadrant and adjacent to the spleen. In spite of this enteric perforation, there are no definable/drainable fluid collections within the abdomen or pelvis. The cecum is noted to be located within the right mid hemi abdomen. Normal appearance of the terminal ileum and appendix. No pneumatosis or portal venous gas. Vascular/Lymphatic: Moderate amount of eccentric mixed calcified and noncalcified atherosclerotic plaque within a normal caliber abdominal aorta. The major branch vessels of the abdominal aorta appear patent on this non CTA examination. No bulky retroperitoneal, mesenteric, pelvic or inguinal lymphadenopathy. Reproductive: Normal appearance of the prostate gland. No free fluid in the pelvic cul-de-sac. Other: Small bilateral mesenteric fat containing indirect inguinal hernias Musculoskeletal: No acute or aggressive osseous abnormalities. An intraosseous hemangioma is noted within the L4 vertebral body IMPRESSION: 1. Findings compatible with acute perforated sigmoid diverticulitis with small to moderate amount of pneumoperitoneum extending along the retroperitoneum to the level of the left upper abdominal quadrant adjacent to the spleen. In spite of this enteric perforation, there is no definable/drainable fluid collection. No evidence of enteric obstruction. 2. Nonobstructing left-sided  nephrolithiasis. 3.  Aortic Atherosclerosis (ICD10-170.0) Critical Value/emergent results were called by telephone at the time of interpretation on 10/21/2015 at 12:34 am to Dr. Shanon Rosser , who verbally acknowledged these results. Electronically Signed   By: Sandi Mariscal M.D.   On: 10/21/2015 00:37  12:48 AM Zosyn ordered for acute  diverticulitis. Patient now complaining of gross hematuria.  3:33 AM Dr. Marcello Moores of General Surgery will admit patient.  Procedures (including critical care time)  Medications Ordered in ED Medications  piperacillin-tazobactam (ZOSYN) IVPB 3.375 g (not administered)  HYDROmorphone (DILAUDID) injection 1 mg (not administered)  0.9 %  sodium chloride infusion ( Intravenous New Bag/Given 10/20/15 2329)  ondansetron (ZOFRAN) injection 4 mg (4 mg Intravenous Given 10/20/15 2328)  fentaNYL (SUBLIMAZE) injection 100 mcg (100 mcg Intravenous Given 10/20/15 2328)  iopamidol (ISOVUE-300) 61 % injection 100 mL (100 mLs Intravenous Contrast Given 10/20/15 2353)    Final Clinical Impressions(s) / ED Diagnoses   Final diagnoses:  Diverticulitis of small intestine with perforation without bleeding    I personally performed the services described in this documentation, which was scribed in my presence. The recorded information has been reviewed and is accurate.    Shanon Rosser, MD 10/21/15 [id number]    Shanon Rosser, MD 10/21/15 [phone]

## 2015-10-20 NOTE — ED Notes (Signed)
Pt transported to CT ?

## 2015-10-21 ENCOUNTER — Encounter (HOSPITAL_COMMUNITY): Payer: Self-pay

## 2015-10-21 DIAGNOSIS — K5720 Diverticulitis of large intestine with perforation and abscess without bleeding: Secondary | ICD-10-CM | POA: Diagnosis present

## 2015-10-21 DIAGNOSIS — K5792 Diverticulitis of intestine, part unspecified, without perforation or abscess without bleeding: Secondary | ICD-10-CM | POA: Diagnosis present

## 2015-10-21 LAB — CREATININE, SERUM: CREATININE: 0.74 mg/dL (ref 0.61–1.24)

## 2015-10-21 LAB — CBC
HEMATOCRIT: 42.9 % (ref 39.0–52.0)
Hemoglobin: 14.9 g/dL (ref 13.0–17.0)
MCH: 30.7 pg (ref 26.0–34.0)
MCHC: 34.7 g/dL (ref 30.0–36.0)
MCV: 88.5 fL (ref 78.0–100.0)
PLATELETS: 237 10*3/uL (ref 150–400)
RBC: 4.85 MIL/uL (ref 4.22–5.81)
RDW: 12.7 % (ref 11.5–15.5)
WBC: 13.2 10*3/uL — AB (ref 4.0–10.5)

## 2015-10-21 MED ORDER — HYDROMORPHONE HCL 1 MG/ML IJ SOLN
1.0000 mg | Freq: Once | INTRAMUSCULAR | Status: AC
  Administered 2015-10-21: 1 mg via INTRAVENOUS
  Filled 2015-10-21: qty 1

## 2015-10-21 MED ORDER — SODIUM CHLORIDE 0.9 % IV SOLN
Freq: Once | INTRAVENOUS | Status: AC
  Administered 2015-10-21: 05:00:00 via INTRAVENOUS

## 2015-10-21 MED ORDER — KCL IN DEXTROSE-NACL 20-5-0.45 MEQ/L-%-% IV SOLN
INTRAVENOUS | Status: DC
  Administered 2015-10-21 – 2015-10-27 (×12): via INTRAVENOUS
  Filled 2015-10-21 (×16): qty 1000

## 2015-10-21 MED ORDER — ENOXAPARIN SODIUM 40 MG/0.4ML ~~LOC~~ SOLN
40.0000 mg | SUBCUTANEOUS | Status: DC
  Administered 2015-10-21 – 2015-10-25 (×4): 40 mg via SUBCUTANEOUS
  Filled 2015-10-21 (×5): qty 0.4

## 2015-10-21 MED ORDER — DEXTROSE 5 % IV SOLN
2.0000 g | INTRAVENOUS | Status: DC
  Administered 2015-10-21 – 2015-10-27 (×7): 2 g via INTRAVENOUS
  Filled 2015-10-21 (×8): qty 2

## 2015-10-21 MED ORDER — HYDROMORPHONE HCL 1 MG/ML IJ SOLN
1.0000 mg | INTRAMUSCULAR | Status: DC | PRN
  Administered 2015-10-21: 1 mg via INTRAVENOUS
  Administered 2015-10-21: 2 mg via INTRAVENOUS
  Administered 2015-10-22 – 2015-10-23 (×5): 1 mg via INTRAVENOUS
  Filled 2015-10-21: qty 1
  Filled 2015-10-21: qty 2
  Filled 2015-10-21 (×5): qty 1

## 2015-10-21 MED ORDER — ONDANSETRON HCL 4 MG/2ML IJ SOLN
4.0000 mg | Freq: Three times a day (TID) | INTRAMUSCULAR | Status: AC | PRN
  Administered 2015-10-21: 4 mg via INTRAVENOUS
  Filled 2015-10-21: qty 2

## 2015-10-21 MED ORDER — METRONIDAZOLE IN NACL 5-0.79 MG/ML-% IV SOLN
500.0000 mg | Freq: Three times a day (TID) | INTRAVENOUS | Status: DC
  Administered 2015-10-21 – 2015-10-27 (×19): 500 mg via INTRAVENOUS
  Filled 2015-10-21 (×19): qty 100

## 2015-10-21 MED ORDER — PIPERACILLIN-TAZOBACTAM 3.375 G IVPB 30 MIN
3.3750 g | Freq: Once | INTRAVENOUS | Status: AC
  Administered 2015-10-21: 3.375 g via INTRAVENOUS
  Filled 2015-10-21: qty 50

## 2015-10-21 MED ORDER — DIPHENHYDRAMINE HCL 50 MG/ML IJ SOLN
12.5000 mg | Freq: Three times a day (TID) | INTRAMUSCULAR | Status: DC | PRN
  Administered 2015-10-21: 12.5 mg via INTRAVENOUS
  Filled 2015-10-21: qty 1

## 2015-10-21 MED ORDER — LORAZEPAM 2 MG/ML IJ SOLN: 1.0000 mg | Freq: Three times a day (TID) | INTRAMUSCULAR | Status: DC | PRN

## 2015-10-21 MED ORDER — HYDROMORPHONE HCL 1 MG/ML IJ SOLN
1.0000 mg | INTRAMUSCULAR | Status: DC | PRN
  Administered 2015-10-21 (×2): 1 mg via INTRAVENOUS
  Filled 2015-10-21 (×2): qty 1

## 2015-10-21 NOTE — ED Notes (Addendum)
Nevin Bloodgood (wife) [phone]

## 2015-10-21 NOTE — H&P (Signed)
Theodore Greene is an 55 y.o. male.   Chief Complaint: Abd pain HPI: 55 y.o. M presents to the ED with gradual onset of LLQ pain.  This is associated with fever and nausea.  Pain is worse with movement.  Onset was ~2 days ago.  Pt has a known h/o diverticular disease.    Past Medical History:  Diagnosis Date  . Allergy   . Hypertension     History reviewed. No pertinent surgical history.  Family History  Problem Relation Age of Onset  . Cancer Father    Social History:  reports that he has been smoking.  He does not have any smokeless tobacco history on file. He reports that he does not drink alcohol or use drugs.  Allergies:  Allergies  Allergen Reactions  . Doxycycline Nausea And Vomiting    Medications Prior to Admission  Medication Sig Dispense Refill  . amLODipine (NORVASC) 5 MG tablet Take 1 tablet (5 mg total) by mouth daily. 30 tablet 11  . aspirin-acetaminophen-caffeine (EXCEDRIN MIGRAINE) 154-008-67 MG tablet Take 1 tablet by mouth every 6 (six) hours as needed for headache.    . docusate sodium (COLACE) 100 MG capsule Take 100 mg by mouth daily as needed for mild constipation.    . fluticasone (FLONASE) 50 MCG/ACT nasal spray Place 2 sprays into both nostrils daily. 16 g 6  . ibuprofen (ADVIL,MOTRIN) 200 MG tablet Take 400 mg by mouth every 6 (six) hours as needed for moderate pain.    Marland Kitchen amoxicillin (AMOXIL) 500 MG capsule Take 1 pill  3 times a day for 3 weeks (Patient not taking: Reported on 08/05/2015) 63 capsule 0  . amoxicillin-clavulanate (AUGMENTIN) 875-125 MG tablet Take 1 tablet by mouth 2 (two) times daily. (Patient not taking: Reported on 10/20/2015) 20 tablet 0    Results for orders placed or performed during the hospital encounter of 10/20/15 (from the past 48 hour(s))  Urinalysis, Routine w reflex microscopic     Status: Abnormal   Collection Time: 10/20/15  5:17 PM  Result Value Ref Range   Color, Urine ORANGE (A) YELLOW    Comment: BIOCHEMICALS MAY BE  AFFECTED BY COLOR   APPearance CLEAR CLEAR   Specific Gravity, Urine 1.023 1.005 - 1.030   pH 5.5 5.0 - 8.0   Glucose, UA NEGATIVE NEGATIVE mg/dL   Hgb urine dipstick MODERATE (A) NEGATIVE   Bilirubin Urine SMALL (A) NEGATIVE   Ketones, ur 40 (A) NEGATIVE mg/dL   Protein, ur 30 (A) NEGATIVE mg/dL   Nitrite NEGATIVE NEGATIVE   Leukocytes, UA NEGATIVE NEGATIVE  Urine microscopic-add on     Status: Abnormal   Collection Time: 10/20/15  5:17 PM  Result Value Ref Range   Squamous Epithelial / LPF 0-5 (A) NONE SEEN   WBC, UA 0-5 0 - 5 WBC/hpf   RBC / HPF 6-30 0 - 5 RBC/hpf   Bacteria, UA RARE (A) NONE SEEN   Casts HYALINE CASTS (A) NEGATIVE   Urine-Other MUCOUS PRESENT   Lipase, blood     Status: None   Collection Time: 10/20/15  5:57 PM  Result Value Ref Range   Lipase 17 11 - 51 U/L  Comprehensive metabolic panel     Status: Abnormal   Collection Time: 10/20/15  5:57 PM  Result Value Ref Range   Sodium 136 135 - 145 mmol/L   Potassium 3.8 3.5 - 5.1 mmol/L   Chloride 101 101 - 111 mmol/L   CO2 25 22 - 32  mmol/L   Glucose, Bld 94 65 - 99 mg/dL   BUN 12 6 - 20 mg/dL   Creatinine, Ser 1.00 0.61 - 1.24 mg/dL   Calcium 9.4 8.9 - 10.3 mg/dL   Total Protein 8.5 (H) 6.5 - 8.1 g/dL   Albumin 4.7 3.5 - 5.0 g/dL   AST 18 15 - 41 U/L   ALT 15 (L) 17 - 63 U/L   Alkaline Phosphatase 102 38 - 126 U/L   Total Bilirubin 2.2 (H) 0.3 - 1.2 mg/dL   GFR calc non Af Amer >60 >60 mL/min   GFR calc Af Amer >60 >60 mL/min    Comment: (NOTE) The eGFR has been calculated using the CKD EPI equation. This calculation has not been validated in all clinical situations. eGFR's persistently <60 mL/min signify possible Chronic Kidney Disease.    Anion gap 10 5 - 15  CBC     Status: Abnormal   Collection Time: 10/20/15  5:57 PM  Result Value Ref Range   WBC 16.6 (H) 4.0 - 10.5 K/uL   RBC 5.70 4.22 - 5.81 MIL/uL   Hemoglobin 17.6 (H) 13.0 - 17.0 g/dL   HCT 51.6 39.0 - 52.0 %   MCV 90.5 78.0 - 100.0  fL   MCH 30.9 26.0 - 34.0 pg   MCHC 34.1 30.0 - 36.0 g/dL   RDW 12.4 11.5 - 15.5 %   Platelets 274 150 - 400 K/uL  Differential     Status: Abnormal   Collection Time: 10/20/15  5:57 PM  Result Value Ref Range   Neutrophils Relative % 85 %   Neutro Abs 14.6 (H) 1.7 - 7.7 K/uL   Lymphocytes Relative 9 %   Lymphs Abs 1.6 0.7 - 4.0 K/uL   Monocytes Relative 6 %   Monocytes Absolute 1.0 0.1 - 1.0 K/uL   Eosinophils Relative 0 %   Eosinophils Absolute 0.0 0.0 - 0.7 K/uL   Basophils Relative 0 %   Basophils Absolute 0.0 0.0 - 0.1 K/uL   Ct Abdomen Pelvis W Contrast  Result Date: 10/21/2015 CLINICAL DATA:  Left lower quadrant abdominal pain. Intermittent fever and nausea. History of hypertension and renal stones. EXAM: CT ABDOMEN AND PELVIS WITH CONTRAST TECHNIQUE: Multidetector CT imaging of the abdomen and pelvis was performed using the standard protocol following bolus administration of intravenous contrast. CONTRAST:  174m ISOVUE-300 IOPAMIDOL (ISOVUE-300) INJECTION 61% COMPARISON:  None. FINDINGS: Lower chest: Limited visualization of the lower thorax is negative for focal airspace opacity or pleural effusion. Normal heart size.  No pericardial effusion. Hepatobiliary: Normal hepatic contour. Punctate (approximately 0.8 cm) hypo attenuating lesion within the dome of the left lobe of the liver (image 17, series 2) as well as an approximately 3 mm hypo attenuating lesion within the caudal aspect of the lateral segment of the left lobe of the liver (image 22, series 2) are both too small to accurately characterize though favored to represent hepatic cysts. Normal appearance of the gallbladder given degree distention. No radiopaque gallstones. No intra or extrahepatic biliary duct dilatation. No ascites. Pancreas: Normal appearance of the pancreas Spleen:  Normal appearance of the spleen Adrenals/Urinary Tract: There is symmetric enhancement and excretion of the bilateral kidneys. There are 2  punctate nonobstructing stones within the inferior pole of the left kidney with dominant stone measuring 0.9 cm in greatest short axis diameter. No evidence of right-sided nephrolithiasis. No discrete renal lesions. No urinary obstruction. Normal appearance of the bilateral adrenal glands. Normal appearance of the urinary  bladder given degree distention Stomach/Bowel: Extensive colonic diverticulosis with short segment wall thickening involving the sigmoid colon within the midline of the lower pelvis. This finding is associated with adjacent ill-defined mesenteric stranding and scattered foci of adjacent pneumoperitoneum which extend along the retroperitoneum to the level of the left upper abdominal quadrant and adjacent to the spleen. In spite of this enteric perforation, there are no definable/drainable fluid collections within the abdomen or pelvis. The cecum is noted to be located within the right mid hemi abdomen. Normal appearance of the terminal ileum and appendix. No pneumatosis or portal venous gas. Vascular/Lymphatic: Moderate amount of eccentric mixed calcified and noncalcified atherosclerotic plaque within a normal caliber abdominal aorta. The major branch vessels of the abdominal aorta appear patent on this non CTA examination. No bulky retroperitoneal, mesenteric, pelvic or inguinal lymphadenopathy. Reproductive: Normal appearance of the prostate gland. No free fluid in the pelvic cul-de-sac. Other: Small bilateral mesenteric fat containing indirect inguinal hernias Musculoskeletal: No acute or aggressive osseous abnormalities. An intraosseous hemangioma is noted within the L4 vertebral body IMPRESSION: 1. Findings compatible with acute perforated sigmoid diverticulitis with small to moderate amount of pneumoperitoneum extending along the retroperitoneum to the level of the left upper abdominal quadrant adjacent to the spleen. In spite of this enteric perforation, there is no definable/drainable fluid  collection. No evidence of enteric obstruction. 2. Nonobstructing left-sided nephrolithiasis. 3.  Aortic Atherosclerosis (ICD10-170.0) Critical Value/emergent results were called by telephone at the time of interpretation on 10/21/2015 at 12:34 am to Dr. Shanon Rosser , who verbally acknowledged these results. Electronically Signed   By: Sandi Mariscal M.D.   On: 10/21/2015 00:37   Review of Systems  Constitutional: Positive for fever. Negative for chills.  HENT: Negative for hearing loss.   Eyes: Negative for blurred vision and double vision.  Respiratory: Negative for cough and shortness of breath.   Cardiovascular: Negative for chest pain and palpitations.  Gastrointestinal: Positive for abdominal pain and nausea. Negative for blood in stool and vomiting.  Genitourinary: Negative for dysuria, frequency and urgency.  Skin: Negative for rash.  Neurological: Negative for headaches.    Blood pressure 131/86, pulse 93, temperature 98 F (36.7 C), temperature source Oral, resp. rate 18, height _0  (1.727 m), weight 81.2 kg (179 lb), SpO2 95 %. Physical Exam  Constitutional: He is oriented to person, place, and time. He appears well-developed and well-nourished. No distress.  HENT:  Head: Normocephalic and atraumatic.  Eyes: Conjunctivae are normal. Pupils are equal, round, and reactive to light.  Neck: Normal range of motion.  Cardiovascular: Normal rate and regular rhythm.   Respiratory: Effort normal and breath sounds normal. No respiratory distress.  GI: Soft. He exhibits no distension. There is tenderness. There is no rebound and no guarding.  LLQ  Musculoskeletal: Normal range of motion.  Neurological: He is alert and oriented to person, place, and time.  Skin: Skin is warm and dry. He is not diaphoretic.     Assessment/Plan Pt admitted to the floor.  IV antibiotics and fluids started.  Keep NPO today.  If pain resolves with conservative treatment, will advance diet and plan on  outpatient surgery in a few months.  High risk for abscess development or need for emergent surgery.    Rosario Adie., MD 1/59/4707, 7:19 AM

## 2015-10-21 NOTE — Progress Notes (Addendum)
Nursing Note: Pt arrived via stretcher.Pt able to stand and walk to the bed.Pt was medicated for pain prior to arrival and says his pain is  much better and rates it at a "4".T-98.0 P-93 R-18 BP-131/80 PO2 95% on r/a.Pt oriented to the room and use of call bell.Pain is in his L LQ.Pt says he will call if he needs anything.Pt resting quietly in bed.Pt able to verbalize that he is NPO for possibility of surgery.wbb

## 2015-10-22 ENCOUNTER — Encounter (HOSPITAL_COMMUNITY): Payer: Self-pay

## 2015-10-22 LAB — BASIC METABOLIC PANEL
Anion gap: 5 (ref 5–15)
BUN: 10 mg/dL (ref 6–20)
CHLORIDE: 105 mmol/L (ref 101–111)
CO2: 25 mmol/L (ref 22–32)
CREATININE: 0.77 mg/dL (ref 0.61–1.24)
Calcium: 8.1 mg/dL — ABNORMAL LOW (ref 8.9–10.3)
GFR calc Af Amer: >60 mL/min (ref >60)
GFR calc non Af Amer: >60 mL/min (ref >60)
GLUCOSE: 111 mg/dL — AB (ref 65–99)
POTASSIUM: 3.8 mmol/L (ref 3.5–5.1)
SODIUM: 135 mmol/L (ref 135–145)

## 2015-10-22 LAB — CBC
HEMATOCRIT: 38.4 % — AB (ref 39.0–52.0)
Hemoglobin: 13.3 g/dL (ref 13.0–17.0)
MCH: 30.6 pg (ref 26.0–34.0)
MCHC: 34.6 g/dL (ref 30.0–36.0)
MCV: 88.5 fL (ref 78.0–100.0)
PLATELETS: 216 10*3/uL (ref 150–400)
RBC: 4.34 MIL/uL (ref 4.22–5.81)
RDW: 12.5 % (ref 11.5–15.5)
WBC: 9.7 10*3/uL (ref 4.0–10.5)

## 2015-10-22 NOTE — Progress Notes (Signed)
Subjective: Less abdominal pain.  Some pain in suprapubic area and penis after voiding.  Objective: Vital signs in last 24 hours: Temp:  [97.8 F (36.6 C)-98.7 F (37.1 C)] 98.7 F (37.1 C) (07/26 0526) Pulse Rate:  [70-75] 70 (07/26 0526) Resp:  [16-18] 16 (07/26 0526) BP: (113-140)/(75-88) 113/80 (07/26 0526) SpO2:  [97 %-98 %] 97 % (07/26 0526) Last BM Date: 10/20/15  Intake/Output from previous day: 07/25 0701 - 07/26 0700 In: 1587 [I.V.:1587] Out: 1400 [Urine:1400] Intake/Output this shift: No intake/output data recorded.  PE: General- In NAD Abdomen-soft, mild to moderate LLQ tenderness with no guarding.  Lab Results:   Recent Labs  10/21/15 0822 10/22/15 0350  WBC 13.2* 9.7  HGB 14.9 13.3  HCT 42.9 38.4*  PLT 237 216   BMET  Recent Labs  10/20/15 1757 10/21/15 0822 10/22/15 0350  NA 136  --  135  K 3.8  --  3.8  CL 101  --  105  CO2 25  --  25  GLUCOSE 94  --  111*  BUN 12  --  10  CREATININE 1.00 0.74 0.77  CALCIUM 9.4  --  8.1*   PT/INR No results for input(s): LABPROT, INR in the last 72 hours. Comprehensive Metabolic Panel:    Component Value Date/Time   NA 135 10/22/2015 0350   NA 136 10/20/2015 1757   K 3.8 10/22/2015 0350   K 3.8 10/20/2015 1757   CL 105 10/22/2015 0350   CL 101 10/20/2015 1757   CO2 25 10/22/2015 0350   CO2 25 10/20/2015 1757   BUN 10 10/22/2015 0350   BUN 12 10/20/2015 1757   CREATININE 0.77 10/22/2015 0350   CREATININE 0.74 10/21/2015 0822   CREATININE 0.98 08/05/2015 1140   CREATININE 0.89 08/22/2013 1212   GLUCOSE 111 (H) 10/22/2015 0350   GLUCOSE 94 10/20/2015 1757   CALCIUM 8.1 (L) 10/22/2015 0350   CALCIUM 9.4 10/20/2015 1757   AST 18 10/20/2015 1757   AST 18 08/05/2015 1140   ALT 15 (L) 10/20/2015 1757   ALT 16 08/05/2015 1140   ALKPHOS 102 10/20/2015 1757   ALKPHOS 115 08/05/2015 1140   BILITOT 2.2 (H) 10/20/2015 1757   BILITOT 0.9 08/05/2015 1140   PROT 8.5 (H) 10/20/2015 1757   PROT 7.3  08/05/2015 1140   ALBUMIN 4.7 10/20/2015 1757   ALBUMIN 4.5 08/05/2015 1140     Studies/Results: Ct Abdomen Pelvis W Contrast  Result Date: 10/21/2015 CLINICAL DATA:  Left lower quadrant abdominal pain. Intermittent fever and nausea. History of hypertension and renal stones. EXAM: CT ABDOMEN AND PELVIS WITH CONTRAST TECHNIQUE: Multidetector CT imaging of the abdomen and pelvis was performed using the standard protocol following bolus administration of intravenous contrast. CONTRAST:  174mL ISOVUE-300 IOPAMIDOL (ISOVUE-300) INJECTION 61% COMPARISON:  None. FINDINGS: Lower chest: Limited visualization of the lower thorax is negative for focal airspace opacity or pleural effusion. Normal heart size.  No pericardial effusion. Hepatobiliary: Normal hepatic contour. Punctate (approximately 0.8 cm) hypo attenuating lesion within the dome of the left lobe of the liver (image 17, series 2) as well as an approximately 3 mm hypo attenuating lesion within the caudal aspect of the lateral segment of the left lobe of the liver (image 22, series 2) are both too small to accurately characterize though favored to represent hepatic cysts. Normal appearance of the gallbladder given degree distention. No radiopaque gallstones. No intra or extrahepatic biliary duct dilatation. No ascites. Pancreas: Normal appearance of the pancreas Spleen:  Normal appearance of the spleen Adrenals/Urinary Tract: There is symmetric enhancement and excretion of the bilateral kidneys. There are 2 punctate nonobstructing stones within the inferior pole of the left kidney with dominant stone measuring 0.9 cm in greatest short axis diameter. No evidence of right-sided nephrolithiasis. No discrete renal lesions. No urinary obstruction. Normal appearance of the bilateral adrenal glands. Normal appearance of the urinary bladder given degree distention Stomach/Bowel: Extensive colonic diverticulosis with short segment wall thickening involving the  sigmoid colon within the midline of the lower pelvis. This finding is associated with adjacent ill-defined mesenteric stranding and scattered foci of adjacent pneumoperitoneum which extend along the retroperitoneum to the level of the left upper abdominal quadrant and adjacent to the spleen. In spite of this enteric perforation, there are no definable/drainable fluid collections within the abdomen or pelvis. The cecum is noted to be located within the right mid hemi abdomen. Normal appearance of the terminal ileum and appendix. No pneumatosis or portal venous gas. Vascular/Lymphatic: Moderate amount of eccentric mixed calcified and noncalcified atherosclerotic plaque within a normal caliber abdominal aorta. The major branch vessels of the abdominal aorta appear patent on this non CTA examination. No bulky retroperitoneal, mesenteric, pelvic or inguinal lymphadenopathy. Reproductive: Normal appearance of the prostate gland. No free fluid in the pelvic cul-de-sac. Other: Small bilateral mesenteric fat containing indirect inguinal hernias Musculoskeletal: No acute or aggressive osseous abnormalities. An intraosseous hemangioma is noted within the L4 vertebral body IMPRESSION: 1. Findings compatible with acute perforated sigmoid diverticulitis with small to moderate amount of pneumoperitoneum extending along the retroperitoneum to the level of the left upper abdominal quadrant adjacent to the spleen. In spite of this enteric perforation, there is no definable/drainable fluid collection. No evidence of enteric obstruction. 2. Nonobstructing left-sided nephrolithiasis. 3.  Aortic Atherosclerosis (ICD10-170.0) Critical Value/emergent results were called by telephone at the time of interpretation on 10/21/2015 at 12:34 am to Dr. Shanon Rosser , who verbally acknowledged these results. Electronically Signed   By: Sandi Mariscal M.D.   On: 10/21/2015 00:37   Anti-infectives: Anti-infectives    Start     Dose/Rate Route  Frequency Ordered Stop   10/21/15 0800  metroNIDAZOLE (FLAGYL) IVPB 500 mg     500 mg 100 mL/hr over 60 Minutes Intravenous Every 8 hours 10/21/15 0649     10/21/15 0730  cefTRIAXone (ROCEPHIN) 2 g in dextrose 5 % 50 mL IVPB     2 g 100 mL/hr over 30 Minutes Intravenous Every 24 hours 10/21/15 0649     10/21/15 0045  piperacillin-tazobactam (ZOSYN) IVPB 3.375 g     3.375 g 100 mL/hr over 30 Minutes Intravenous  Once 10/21/15 0041 10/21/15 0128      Assessment Active Problems:   Diverticulitis of colon with perforation-getting better with current treatment.   HTN-BP normal    LOS: 1 day   Plan: Continue bowel rest and current abx regimen.  Ice chips. Ambulate.   Theodore Greene Lenna Sciara 10/22/2015

## 2015-10-23 LAB — CBC
HEMATOCRIT: 40.1 % (ref 39.0–52.0)
Hemoglobin: 14.1 g/dL (ref 13.0–17.0)
MCH: 30.9 pg (ref 26.0–34.0)
MCHC: 35.2 g/dL (ref 30.0–36.0)
MCV: 87.9 fL (ref 78.0–100.0)
Platelets: 233 10*3/uL (ref 150–400)
RBC: 4.56 MIL/uL (ref 4.22–5.81)
RDW: 12.5 % (ref 11.5–15.5)
WBC: 7.5 10*3/uL (ref 4.0–10.5)

## 2015-10-23 NOTE — Progress Notes (Signed)
  Subjective: No abdominal pain today.  Passing gas.  Bowels moving.  Objective: Vital signs in last 24 hours: Temp:  [97.9 F (36.6 C)-98.9 F (37.2 C)] 97.9 F (36.6 C) (07/27 0640) Pulse Rate:  [60-79] 60 (07/27 0640) Resp:  [20] 20 (07/27 0640) BP: (118-124)/(70-72) 118/72 (07/27 0640) SpO2:  [94 %-98 %] 98 % (07/27 0640) Last BM Date: 10/22/15  Intake/Output from previous day: 07/26 0701 - 07/27 0700 In: 2154 [I.V.:2154] Out: 1300 [Urine:1300] Intake/Output this shift: Total I/O In: 0  Out: 500 [Urine:500]  PE: General- In NAD Abdomen-soft, no tenderness today  Lab Results:   Recent Labs  10/22/15 0350 10/23/15 0357  WBC 9.7 7.5  HGB 13.3 14.1  HCT 38.4* 40.1  PLT 216 233   BMET  Recent Labs  10/20/15 1757 10/21/15 0822 10/22/15 0350  NA 136  --  135  K 3.8  --  3.8  CL 101  --  105  CO2 25  --  25  GLUCOSE 94  --  111*  BUN 12  --  10  CREATININE 1.00 0.74 0.77  CALCIUM 9.4  --  8.1*   PT/INR No results for input(s): LABPROT, INR in the last 72 hours. Comprehensive Metabolic Panel:    Component Value Date/Time   NA 135 10/22/2015 0350   NA 136 10/20/2015 1757   K 3.8 10/22/2015 0350   K 3.8 10/20/2015 1757   CL 105 10/22/2015 0350   CL 101 10/20/2015 1757   CO2 25 10/22/2015 0350   CO2 25 10/20/2015 1757   BUN 10 10/22/2015 0350   BUN 12 10/20/2015 1757   CREATININE 0.77 10/22/2015 0350   CREATININE 0.74 10/21/2015 0822   CREATININE 0.98 08/05/2015 1140   CREATININE 0.89 08/22/2013 1212   GLUCOSE 111 (H) 10/22/2015 0350   GLUCOSE 94 10/20/2015 1757   CALCIUM 8.1 (L) 10/22/2015 0350   CALCIUM 9.4 10/20/2015 1757   AST 18 10/20/2015 1757   AST 18 08/05/2015 1140   ALT 15 (L) 10/20/2015 1757   ALT 16 08/05/2015 1140   ALKPHOS 102 10/20/2015 1757   ALKPHOS 115 08/05/2015 1140   BILITOT 2.2 (H) 10/20/2015 1757   BILITOT 0.9 08/05/2015 1140   PROT 8.5 (H) 10/20/2015 1757   PROT 7.3 08/05/2015 1140   ALBUMIN 4.7 10/20/2015 1757    ALBUMIN 4.5 08/05/2015 1140     Studies/Results: No results found.  Anti-infectives: Anti-infectives    Start     Dose/Rate Route Frequency Ordered Stop   10/21/15 0800  metroNIDAZOLE (FLAGYL) IVPB 500 mg     500 mg 100 mL/hr over 60 Minutes Intravenous Every 8 hours 10/21/15 0649     10/21/15 0730  cefTRIAXone (ROCEPHIN) 2 g in dextrose 5 % 50 mL IVPB     2 g 100 mL/hr over 30 Minutes Intravenous Every 24 hours 10/21/15 0649     10/21/15 0045  piperacillin-tazobactam (ZOSYN) IVPB 3.375 g     3.375 g 100 mL/hr over 30 Minutes Intravenous  Once 10/21/15 0041 10/21/15 0128      Assessment Active Problems:   Diverticulitis of colon with perforation-continues to improve on current abxs.    HTN-BP normal    LOS: 2 days   Plan: Clear liquids.  Continue current abxs.   Theodore Greene Lenna Sciara 10/23/2015

## 2015-10-23 NOTE — Progress Notes (Signed)
Date:  October 23, 2015 Chart reviewed for concurrent status and case management needs. Will continue to follow the patient for changes and needs:  Iv flds and iv abx, npo, Velva Harman, BSN, [place], [place]

## 2015-10-24 NOTE — Plan of Care (Signed)
Problem: Unsupported Beliefs/Attitudes About Food or Nutrition-Related Topics (NB-1.2) Goal: Nutrition education Formal process to instruct or train a patient/client in a skill or to impart knowledge to help patients/clients voluntarily manage or modify food choices and eating behavior to maintain or improve health. Outcome: Completed/Met Date Met: 10/24/15 Nutrition Education Note  RD consulted for nutrition education regarding a low fiber diet for diverticulitis.  RD provided "Fiber Restricted Nutrition Therapy" handout from the Academy of [employer]. Reviewed patient's dietary recall. Provided examples of low and high fiber foods. Discouraged intake of high fiber foods, processed foods and caffeine. Encouraged use of a multi-vitamin while following a low fiber diet. Teach back method used.  Expect fair compliance.  Body mass index is 27.22 kg/m. Pt meets criteria for overweight based on current BMI.  Current diet order is full liquid, patient is consuming approximately 0-5% of meals at this time. Labs and medications reviewed. No further nutrition interventions warranted at this time. RD contact information provided. If additional nutrition issues arise, please re-consult RD.  Satira Anis. Marlean Mortell, MS, RD LDN Inpatient Clinical Dietitian Pager [phone]

## 2015-10-24 NOTE — Progress Notes (Signed)
Subjective: Doing much better, much less tender.  He can still feel it.  Concerned with ongoing BM's.  Painful with each BM.  He has some hematuria when he came into the hospital, that has resolved, no pneumouria he is aware of.  Tolerating clears, but afraid to take in much yesterday.    Objective: Vital signs in last 24 hours: Temp:  [98.2 F (36.8 C)-99.1 F (37.3 C)] 98.2 F (36.8 C) (07/28 0606) Pulse Rate:  [64-73] 64 (07/28 0606) Resp:  [16] 16 (07/28 0606) BP: (133-156)/(87-93) 156/93 (07/28 0606) SpO2:  [97 %-99 %] 97 % (07/28 0606) Last BM Date: 10/24/15 100 PO recorded Urine 500 recorded BM x 1 Afebrile, VSS BP up some  CBC is normal  CT scan 10/21/15. Intake/Output from previous day: 07/27 0701 - 07/28 0700 In: 4272.9 [P.O.:100; I.V.:3172.9; IV Piggyback:1000] Out: 500 [Urine:500] Intake/Output this shift: No intake/output data recorded.  General appearance: alert, cooperative and no distress Resp: clear to auscultation bilaterally GI: soft, still sore LLQ but much better, BM's still painful    Lab Results:   Recent Labs  10/22/15 0350 10/23/15 0357  WBC 9.7 7.5  HGB 13.3 14.1  HCT 38.4* 40.1  PLT 216 233    BMET  Recent Labs  10/22/15 0350  NA 135  K 3.8  CL 105  CO2 25  GLUCOSE 111*  BUN 10  CREATININE 0.77  CALCIUM 8.1*   PT/INR No results for input(s): LABPROT, INR in the last 72 hours.   Recent Labs Lab 10/20/15 1757  AST 18  ALT 15*  ALKPHOS 102  BILITOT 2.2*  PROT 8.5*  ALBUMIN 4.7     Lipase     Component Value Date/Time   LIPASE 17 10/20/2015 1757     Studies/Results: No results found. Prior to Admission medications   Medication Sig Start Date End Date Taking? Authorizing Provider  amLODipine (NORVASC) 5 MG tablet Take 1 tablet (5 mg total) by mouth daily. 08/05/15  Yes Darlyne Russian, MD  aspirin-acetaminophen-caffeine (EXCEDRIN MIGRAINE) [phone] MG tablet Take 1 tablet by mouth every 6 (six) hours as needed  for headache.   Yes Historical Provider, MD  docusate sodium (COLACE) 100 MG capsule Take 100 mg by mouth daily as needed for mild constipation.   Yes Historical Provider, MD  fluticasone (FLONASE) 50 MCG/ACT nasal spray Place 2 sprays into both nostrils daily. 08/05/15  Yes Darlyne Russian, MD  ibuprofen (ADVIL,MOTRIN) 200 MG tablet Take 400 mg by mouth every 6 (six) hours as needed for moderate pain.   Yes Historical Provider, MD  amoxicillin (AMOXIL) 500 MG capsule Take 1 pill  3 times a day for 3 weeks Patient not taking: Reported on 08/05/2015 08/22/13   Darlyne Russian, MD  amoxicillin-clavulanate (AUGMENTIN) 875-125 MG tablet Take 1 tablet by mouth 2 (two) times daily. Patient not taking: Reported on 10/20/2015 08/05/15   Darlyne Russian, MD    Medications: . cefTRIAXone (ROCEPHIN)  IV  2 g Intravenous Q24H   And  . metronidazole  500 mg Intravenous Q8H  . enoxaparin (LOVENOX) injection  40 mg Subcutaneous Q24H   . dextrose 5 % and 0.45 % NaCl with KCl 20 mEq/L 125 mL/hr at 10/24/15 1032    Hypertension  Assessment/Plan  Diverticulitis with colon perforation - Improving on antibiotics FEN:  IV fluids/clear liquids - advance to fulls ID: Day 4 Ceftriaxone/Metronidazole   DVT:  Lovenox/SCD  Plan:  Advance to full liquids, continue IV antibiotics, recheck  labs in AM.  Hopefully home on oral antibiotics this weekend or Monday.       LOS: 3 days    Corliss Coggeshall 10/24/2015 [phone]

## 2015-10-25 LAB — BASIC METABOLIC PANEL
ANION GAP: 7 (ref 5–15)
BUN: <5 mg/dL — ABNORMAL LOW (ref 6–20)
CHLORIDE: 106 mmol/L (ref 101–111)
CO2: 25 mmol/L (ref 22–32)
CREATININE: 0.87 mg/dL (ref 0.61–1.24)
Calcium: 8.8 mg/dL — ABNORMAL LOW (ref 8.9–10.3)
GFR calc non Af Amer: >60 mL/min (ref >60)
GLUCOSE: 116 mg/dL — AB (ref 65–99)
Potassium: 3.9 mmol/L (ref 3.5–5.1)
Sodium: 138 mmol/L (ref 135–145)

## 2015-10-25 LAB — CBC
HEMATOCRIT: 41.7 % (ref 39.0–52.0)
HEMOGLOBIN: 14.5 g/dL (ref 13.0–17.0)
MCH: 30.3 pg (ref 26.0–34.0)
MCHC: 34.8 g/dL (ref 30.0–36.0)
MCV: 87.2 fL (ref 78.0–100.0)
Platelets: 301 10*3/uL (ref 150–400)
RBC: 4.78 MIL/uL (ref 4.22–5.81)
RDW: 12.4 % (ref 11.5–15.5)
WBC: 7.7 10*3/uL (ref 4.0–10.5)

## 2015-10-25 MED ORDER — HYDROCODONE-ACETAMINOPHEN 5-325 MG PO TABS
1.0000 | ORAL_TABLET | ORAL | Status: DC | PRN
  Administered 2015-10-25 – 2015-10-26 (×3): 1 via ORAL
  Filled 2015-10-25 (×3): qty 1

## 2015-10-25 NOTE — Progress Notes (Signed)
Patient ID: Theodore Greene, male   DOB: November 29, 1960, 55 y.o.   MRN: [id number] [facility] Surgery Progress Note:   * No surgery found *  Subjective: Mental status is clear.  Pain much better.  Taking full liquids since yesterday Objective: Vital signs in last 24 hours: Temp:  [98 F (36.7 C)-98.5 F (36.9 C)] 98 F (36.7 C) (07/29 0440) Pulse Rate:  [63-66] 63 (07/29 0440) Resp:  [16] 16 (07/29 0440) BP: (129-144)/(71-90) 129/71 (07/29 0440) SpO2:  [98 %-99 %] 98 % (07/29 0440)  Intake/Output from previous day: 07/28 0701 - 07/29 0700 In: 8880.3 [P.O.:480; I.V.:8303.1; IV Piggyback:97.2] Out: 950 [Urine:950] Intake/Output this shift: No intake/output data recorded.  Physical Exam: Work of breathing is normal.  No left sided pain today.  Lab Results:  Results for orders placed or performed during the hospital encounter of 10/20/15 (from the past 48 hour(s))  CBC     Status: None   Collection Time: 10/25/15  3:36 AM  Result Value Ref Range   WBC 7.7 4.0 - 10.5 K/uL   RBC 4.78 4.22 - 5.81 MIL/uL   Hemoglobin 14.5 13.0 - 17.0 g/dL   HCT 41.7 39.0 - 52.0 %   MCV 87.2 78.0 - 100.0 fL   MCH 30.3 26.0 - 34.0 pg   MCHC 34.8 30.0 - 36.0 g/dL   RDW 12.4 11.5 - 15.5 %   Platelets 301 150 - 400 K/uL  Basic metabolic panel     Status: Abnormal   Collection Time: 10/25/15  3:36 AM  Result Value Ref Range   Sodium 138 135 - 145 mmol/L   Potassium 3.9 3.5 - 5.1 mmol/L   Chloride 106 101 - 111 mmol/L   CO2 25 22 - 32 mmol/L   Glucose, Bld 116 (H) 65 - 99 mg/dL   BUN <5 (L) 6 - 20 mg/dL   Creatinine, Ser 0.87 0.61 - 1.24 mg/dL   Calcium 8.8 (L) 8.9 - 10.3 mg/dL   GFR calc non Af Amer >60 >60 mL/min   GFR calc Af Amer >60 >60 mL/min    Comment: (NOTE) The eGFR has been calculated using the CKD EPI equation. This calculation has not been validated in all clinical situations. eGFR's persistently <60 mL/min signify possible Chronic Kidney Disease.    Anion gap 7 5 - 15     Radiology/Results: No results found.  Anti-infectives: Anti-infectives    Start     Dose/Rate Route Frequency Ordered Stop   10/21/15 0800  metroNIDAZOLE (FLAGYL) IVPB 500 mg     500 mg 100 mL/hr over 60 Minutes Intravenous Every 8 hours 10/21/15 0649     10/21/15 0730  cefTRIAXone (ROCEPHIN) 2 g in dextrose 5 % 50 mL IVPB     2 g 100 mL/hr over 30 Minutes Intravenous Every 24 hours 10/21/15 0649     10/21/15 0045  piperacillin-tazobactam (ZOSYN) IVPB 3.375 g     3.375 g 100 mL/hr over 30 Minutes Intravenous  Once 10/21/15 0041 10/21/15 0128      Assessment/Plan: Problem List: Patient Active Problem List   Diagnosis Date Noted  . Diverticulitis of colon with perforation 10/21/2015  . Diverticulitis 10/21/2015    Will probably advance to low residue diet tomorrow.  * No surgery found *    LOS: 4 days   Matt B. Hassell Done, MD, [facility] [phone] beeper [phone]  10/25/2015 9:08 AM

## 2015-10-26 NOTE — Progress Notes (Addendum)
Patient ID: Theodore Greene, male   DOB: Sep 15, 1960, 55 y.o.   MRN: [id number] [facility] Surgery Progress Note:   * No surgery found *  Subjective: Mental status is clear.  Very eager to advance diet Objective: Vital signs in last 24 hours: Temp:  [97.9 F (36.6 C)-98.4 F (36.9 C)] 97.9 F (36.6 C) (07/30 0506) Pulse Rate:  [59-67] 59 (07/30 0506) Resp:  [14-16] 16 (07/30 0506) BP: (139-158)/(86-96) 139/86 (07/30 0506) SpO2:  [96 %-99 %] 96 % (07/30 0506)  Intake/Output from previous day: 07/29 0701 - 07/30 0700 In: 3033.4 [P.O.:360; I.V.:2472.4; IV Piggyback:201] Out: -  Intake/Output this shift: No intake/output data recorded.  Physical Exam: Work of breathing is not labored;  Minimal abdominal soreness;  Has chronic pain in the left hip  Lab Results:  Results for orders placed or performed during the hospital encounter of 10/20/15 (from the past 48 hour(s))  CBC     Status: None   Collection Time: 10/25/15  3:36 AM  Result Value Ref Range   WBC 7.7 4.0 - 10.5 K/uL   RBC 4.78 4.22 - 5.81 MIL/uL   Hemoglobin 14.5 13.0 - 17.0 g/dL   HCT 41.7 39.0 - 52.0 %   MCV 87.2 78.0 - 100.0 fL   MCH 30.3 26.0 - 34.0 pg   MCHC 34.8 30.0 - 36.0 g/dL   RDW 12.4 11.5 - 15.5 %   Platelets 301 150 - 400 K/uL  Basic metabolic panel     Status: Abnormal   Collection Time: 10/25/15  3:36 AM  Result Value Ref Range   Sodium 138 135 - 145 mmol/L   Potassium 3.9 3.5 - 5.1 mmol/L   Chloride 106 101 - 111 mmol/L   CO2 25 22 - 32 mmol/L   Glucose, Bld 116 (H) 65 - 99 mg/dL   BUN <5 (L) 6 - 20 mg/dL   Creatinine, Ser 0.87 0.61 - 1.24 mg/dL   Calcium 8.8 (L) 8.9 - 10.3 mg/dL   GFR calc non Af Amer >60 >60 mL/min   GFR calc Af Amer >60 >60 mL/min    Comment: (NOTE) The eGFR has been calculated using the CKD EPI equation. This calculation has not been validated in all clinical situations. eGFR's persistently <60 mL/min signify possible Chronic Kidney Disease.    Anion gap 7 5 - 15     Radiology/Results: No results found.  Anti-infectives: Anti-infectives    Start     Dose/Rate Route Frequency Ordered Stop   10/21/15 0800  metroNIDAZOLE (FLAGYL) IVPB 500 mg     500 mg 100 mL/hr over 60 Minutes Intravenous Every 8 hours 10/21/15 0649     10/21/15 0730  cefTRIAXone (ROCEPHIN) 2 g in dextrose 5 % 50 mL IVPB     2 g 100 mL/hr over 30 Minutes Intravenous Every 24 hours 10/21/15 0649     10/21/15 0045  piperacillin-tazobactam (ZOSYN) IVPB 3.375 g     3.375 g 100 mL/hr over 30 Minutes Intravenous  Once 10/21/15 0041 10/21/15 0128      Assessment/Plan: Problem List: Patient Active Problem List   Diagnosis Date Noted  . Diverticulitis of colon with perforation 10/21/2015  . Diverticulitis 10/21/2015    Resolving diverticulitis  * No surgery found *    LOS: 5 days   Matt B. Hassell Done, MD, [facility] [phone] beeper [phone]  10/26/2015 9:39 AM   Will repeat CT scan in the AM

## 2015-10-27 MED ORDER — DIATRIZOATE MEGLUMINE & SODIUM 66-10 % PO SOLN
15.0000 mL | ORAL | Status: DC | PRN
  Filled 2015-10-27: qty 30

## 2015-10-27 MED ORDER — AMOXICILLIN-POT CLAVULANATE 875-125 MG PO TABS: 1.0000 | ORAL_TABLET | Freq: Two times a day (BID) | ORAL | 0 refills | Status: DC

## 2015-10-27 MED ORDER — METRONIDAZOLE 500 MG PO TABS: 500.0000 mg | ORAL_TABLET | Freq: Three times a day (TID) | ORAL | 0 refills | Status: AC

## 2015-10-27 NOTE — Discharge Summary (Signed)
Physician Discharge Summary  Patient ID: Theodore Greene MRN: [id number] DOB/AGE: Jul 30, 1960 55 y.o.  Admit date: 10/20/2015 Discharge date: 10/27/2015  Admission Diagnoses:  Acute sigmoid diverticulitis with microperforation  Discharge Diagnoses: Acute sigmoid diverticulitis with microperforation    Discharged Condition: good  Hospital Course: He was admitted and started on IV abxs with bowel rest.  He progressively improved each day.  He was able to be discharged on 10/27/15 on oral antibiotics.  Discharge instructions were given to him.  He will follow up in the office in 2-3 weeks.  He will need a colonoscopy in about 6 weeks.  Discharge Exam: Blood pressure 140/90, pulse 61, temperature 98 F (36.7 C), temperature source Oral, resp. rate 16, height 5\' 8"  (1.727 m), weight 81.2 kg (179 lb), SpO2 97 %.   Disposition: Home     Medication List    STOP taking these medications   amoxicillin 500 MG capsule Commonly known as:  AMOXIL   docusate sodium 100 MG capsule Commonly known as:  COLACE     TAKE these medications   amLODipine 5 MG tablet Commonly known as:  NORVASC Take 1 tablet (5 mg total) by mouth daily.   amoxicillin-clavulanate 875-125 MG tablet Commonly known as:  AUGMENTIN Take 1 tablet by mouth 2 (two) times daily.   aspirin-acetaminophen-caffeine 250-250-65 MG tablet Commonly known as:  EXCEDRIN MIGRAINE Take 1 tablet by mouth every 6 (six) hours as needed for headache.   fluticasone 50 MCG/ACT nasal spray Commonly known as:  FLONASE Place 2 sprays into both nostrils daily.   ibuprofen 200 MG tablet Commonly known as:  ADVIL,MOTRIN Take 400 mg by mouth every 6 (six) hours as needed for moderate pain.   metroNIDAZOLE 500 MG tablet Commonly known as:  FLAGYL Take 1 tablet (500 mg total) by mouth 3 (three) times daily.        Signed: Odis Hollingshead 10/27/2015, 8:54 AM

## 2015-10-27 NOTE — Progress Notes (Signed)
  Subjective: Some soreness in left hip.  Wants to go home.  Tolerating diet.  Objective: Vital signs in last 24 hours: Temp:  [97.3 F (36.3 C)-98 F (36.7 C)] 98 F (36.7 C) (07/31 0536) Pulse Rate:  [57-61] 61 (07/31 0536) Resp:  [14-16] 16 (07/31 0536) BP: (139-148)/(84-90) 140/90 (07/31 0536) SpO2:  [97 %-99 %] 97 % (07/31 0536) Last BM Date: 10/25/15  Intake/Output from previous day: 07/30 0701 - 07/31 0700 In: 2211.6 [P.O.:720; I.V.:1243.3; IV Piggyback:248.3] Out: -  Intake/Output this shift: No intake/output data recorded.  PE: General- In NAD Abdomen-soft, not tender  Lab Results:   Recent Labs  10/25/15 0336  WBC 7.7  HGB 14.5  HCT 41.7  PLT 301   BMET  Recent Labs  10/25/15 0336  NA 138  K 3.9  CL 106  CO2 25  GLUCOSE 116*  BUN <5*  CREATININE 0.87  CALCIUM 8.8*   PT/INR No results for input(s): LABPROT, INR in the last 72 hours. Comprehensive Metabolic Panel:    Component Value Date/Time   NA 138 10/25/2015 0336   NA 135 10/22/2015 0350   K 3.9 10/25/2015 0336   K 3.8 10/22/2015 0350   CL 106 10/25/2015 0336   CL 105 10/22/2015 0350   CO2 25 10/25/2015 0336   CO2 25 10/22/2015 0350   BUN <5 (L) 10/25/2015 0336   BUN 10 10/22/2015 0350   CREATININE 0.87 10/25/2015 0336   CREATININE 0.77 10/22/2015 0350   CREATININE 0.98 08/05/2015 1140   CREATININE 0.89 08/22/2013 1212   GLUCOSE 116 (H) 10/25/2015 0336   GLUCOSE 111 (H) 10/22/2015 0350   CALCIUM 8.8 (L) 10/25/2015 0336   CALCIUM 8.1 (L) 10/22/2015 0350   AST 18 10/20/2015 1757   AST 18 08/05/2015 1140   ALT 15 (L) 10/20/2015 1757   ALT 16 08/05/2015 1140   ALKPHOS 102 10/20/2015 1757   ALKPHOS 115 08/05/2015 1140   BILITOT 2.2 (H) 10/20/2015 1757   BILITOT 0.9 08/05/2015 1140   PROT 8.5 (H) 10/20/2015 1757   PROT 7.3 08/05/2015 1140   ALBUMIN 4.7 10/20/2015 1757   ALBUMIN 4.5 08/05/2015 1140     Studies/Results: No results  found.  Anti-infectives: Anti-infectives    Start     Dose/Rate Route Frequency Ordered Stop   10/21/15 0800  metroNIDAZOLE (FLAGYL) IVPB 500 mg     500 mg 100 mL/hr over 60 Minutes Intravenous Every 8 hours 10/21/15 0649     10/21/15 0730  cefTRIAXone (ROCEPHIN) 2 g in dextrose 5 % 50 mL IVPB     2 g 100 mL/hr over 30 Minutes Intravenous Every 24 hours 10/21/15 0649     10/21/15 0045  piperacillin-tazobactam (ZOSYN) IVPB 3.375 g     3.375 g 100 mL/hr over 30 Minutes Intravenous  Once 10/21/15 0041 10/21/15 0128      Assessment Active Problems:   Diverticulitis of colon with microperforation-continues to improve clinically; I do not think he needs a repeat CT scan.       LOS: 6 days   Plan: Discharge home today on oral abxs, and low fiber diet.  Follow up in office in 2-3 weeks.  Discharge instructions given to him.   Nihal Doan J 10/27/2015

## 2015-10-27 NOTE — Discharge Instructions (Signed)
Take antibiotics as directed.  Take an over the counter probiotic daily.  Low fiber diet-no raw fruits or vegetables; may have cooked mushy vegetables  Avoid red meat.  Do not overeat.  Stay well hydrated.   May return to work 11/03/15.  Call our office and make an appointment to be seen in 2-3 weeks [phone]).  Call for high fever (>101), recurrent severe pain.

## 2015-11-12 ENCOUNTER — Ambulatory Visit (INDEPENDENT_AMBULATORY_CARE_PROVIDER_SITE_OTHER): Payer: Self-pay | Admitting: Physician Assistant

## 2015-11-12 VITALS — BP 138/80 | HR 82 | Temp 98.6°F | Resp 16 | Ht 68.0 in | Wt 178.6 lb

## 2015-11-12 DIAGNOSIS — T476X5A Adverse effect of antidiarrheal drugs, initial encounter: Secondary | ICD-10-CM

## 2015-11-12 DIAGNOSIS — R202 Paresthesia of skin: Secondary | ICD-10-CM

## 2015-11-12 LAB — POCT GLYCOSYLATED HEMOGLOBIN (HGB A1C): HEMOGLOBIN A1C: 5.5

## 2015-11-12 MED ORDER — HYDROXYZINE HCL 10 MG PO TABS: 10.0000 mg | ORAL_TABLET | Freq: Three times a day (TID) | ORAL | 0 refills | Status: DC | PRN

## 2015-11-12 MED ORDER — CETIRIZINE HCL 10 MG PO TABS: 10.0000 mg | ORAL_TABLET | Freq: Every day | ORAL | 11 refills | Status: DC

## 2015-11-12 MED ORDER — RANITIDINE HCL 150 MG PO TABS: 150.0000 mg | ORAL_TABLET | Freq: Once | ORAL | Status: DC

## 2015-11-12 MED ORDER — RANITIDINE HCL 150 MG PO TABS: 150.0000 mg | ORAL_TABLET | Freq: Two times a day (BID) | ORAL | 0 refills | Status: DC

## 2015-11-12 NOTE — Progress Notes (Signed)
11/12/2015 5:26 PM   DOB: March 07, 1961 / MRN: [id number]  SUBJECTIVE:  Theodore Greene is a 55 y.o. male presenting for a tingling of his hand and his feet that started two days ago.  He was recently in the hospital for acute diverticulitis with perf and received Augmentin and Flagyl.  Has taken benadryl and this has helped.  Denies lip, throat and tongue swelling.  He is allergic to doxycycline.   He  has a past medical history of Allergy; Hypertension; and Kidney stones.    He  reports that he has been smoking Cigarettes.  He has been smoking about 0.25 packs per day. He has never used smokeless tobacco. He reports that he does not drink alcohol or use drugs. He  reports that he currently engages in sexual activity. The patient  has no past surgical history on file.  His family history includes Cancer in his father.  Review of Systems  Constitutional: Negative for fever.  Respiratory: Negative for cough.   Cardiovascular: Negative for chest pain.  Gastrointestinal: Negative for abdominal pain.  Musculoskeletal: Negative for myalgias.  Skin: Negative for rash.  Neurological: Positive for tingling. Negative for dizziness, sensory change and headaches.    The problem list and medications were reviewed and updated by myself where necessary and exist elsewhere in the encounter.   OBJECTIVE:  BP 138/80 (BP Location: Left Arm, Patient Position: Sitting, Cuff Size: Normal)   Pulse 82   Temp 98.6 F (37 C) (Oral)   Resp 16   Ht 5\' 8"  (1.727 m)   Wt 178 lb 9.6 oz (81 kg)   SpO2 98%   BMI 27.16 kg/m   Physical Exam  Constitutional: He is oriented to person, place, and time.  Cardiovascular: Normal rate and regular rhythm.   Pulmonary/Chest: Effort normal and breath sounds normal.  Abdominal: Soft. Bowel sounds are normal.  Neurological: He is alert and oriented to person, place, and time.  Skin: Skin is warm and dry.     Vitals reviewed.     Results for orders placed or  performed in visit on 11/12/15 (from the past 72 hour(s))  POCT glycosylated hemoglobin (Hb A1C)     Status: None   Collection Time: 11/12/15  4:25 PM  Result Value Ref Range   Hemoglobin A1C 5.5     No results found.  ASSESSMENT AND PLAN  Theodore Greene was seen today for allergic reaction and urticaria.  Diagnoses and all orders for this visit:  Paresthesia of both hands -     POCT glycosylated hemoglobin (Hb A1C)  Paresthesia of both feet -     POCT glycosylated hemoglobin (Hb A1C)  Adverse reaction of antidiarrheal drug, initial encounter -     cetirizine (ZYRTEC) 10 MG tablet; Take 1 tablet (10 mg total) by mouth daily. -     Discontinue: ranitidine (ZANTAC) tablet 150 mg; Take 1 tablet (150 mg total) by mouth once. -     hydrOXYzine (ATARAX/VISTARIL) 10 MG tablet; Take 1 tablet (10 mg total) by mouth 3 (three) times daily as needed. -     ranitidine (ZANTAC) 150 MG tablet; Take 1 tablet (150 mg total) by mouth 2 (two) times daily.    The patient is advised to call or return to clinic if he does not see an improvement in symptoms, or to seek the care of the closest emergency department if he worsens with the above plan.   [name], MHS, PA-C [facility] 11/12/2015 5:26 PM

## 2015-11-12 NOTE — Patient Instructions (Signed)
     IF you received an x-ray today, you will receive an invoice from [facility]. Please contact [facility] at [phone] with questions or concerns regarding your invoice.   IF you received labwork today, you will receive an invoice from [facility]. Please contact [facility] at [phone] with questions or concerns regarding your invoice.   Our billing staff will not be able to assist you with questions regarding bills from these companies.  You will be contacted with the lab results as soon as they are available. The fastest way to get your results is to activate your My Chart account. Instructions are located on the last page of this paperwork. If you have not heard from us regarding the results in 2 weeks, please contact this office.      

## 2016-09-09 ENCOUNTER — Telehealth: Payer: Self-pay | Admitting: Physician Assistant

## 2016-09-09 NOTE — Telephone Encounter (Signed)
Pt is checking on status of a refill request for his blood pressure meds that was given by Dr. Gillermina Hu has also made an appt with [name] for next Wednesday to refill this and to re-establish with a provider since [name] has retired   [employer] number [phone]

## 2016-09-10 ENCOUNTER — Other Ambulatory Visit: Payer: Self-pay | Admitting: Emergency Medicine

## 2016-09-10 DIAGNOSIS — T476X5A Adverse effect of antidiarrheal drugs, initial encounter: Secondary | ICD-10-CM

## 2016-09-10 MED ORDER — AMLODIPINE BESYLATE 5 MG PO TABS: 5.0000 mg | ORAL_TABLET | Freq: Every day | ORAL | 0 refills | Status: DC

## 2016-09-10 NOTE — Telephone Encounter (Signed)
Clarification noted: Patient correct number [phone] Spoke to patient wife on file Theodore Greene Pt is taking Amlodopine 50mg  tablet Script sent to [facility]   #30 given until office vist with you 6/20

## 2016-09-15 ENCOUNTER — Encounter: Payer: Self-pay | Admitting: Physician Assistant

## 2016-09-15 ENCOUNTER — Ambulatory Visit (INDEPENDENT_AMBULATORY_CARE_PROVIDER_SITE_OTHER): Payer: BLUE CROSS/BLUE SHIELD | Admitting: Physician Assistant

## 2016-09-15 VITALS — BP 137/87 | HR 74 | Temp 98.1°F | Resp 16 | Ht 68.0 in | Wt 182.6 lb

## 2016-09-15 DIAGNOSIS — I10 Essential (primary) hypertension: Secondary | ICD-10-CM

## 2016-09-15 DIAGNOSIS — D229 Melanocytic nevi, unspecified: Secondary | ICD-10-CM | POA: Diagnosis not present

## 2016-09-15 DIAGNOSIS — Z131 Encounter for screening for diabetes mellitus: Secondary | ICD-10-CM

## 2016-09-15 MED ORDER — AMLODIPINE BESYLATE 5 MG PO TABS: 5.0000 mg | ORAL_TABLET | Freq: Every day | ORAL | 3 refills | Status: DC

## 2016-09-15 NOTE — Progress Notes (Signed)
    09/15/2016 4:20 PM   DOB: 1960-10-11 / MRN: [id number]  SUBJECTIVE:  Theodore Greene is a 56 y.o. male presenting for medication refills. He complains of leg swelling with Norvasc along with gut pain. He has also tried lisinopril/HCTZ.  Denies chest pain, SOB, sudden vision changes, chronic HA.   Went to a health fair and was advised that he had some skin lesions on his ear and face that needed medical attention. He would like a referral to dermatology today.   He is allergic to doxycycline.   He  has a past medical history of Allergy; Hypertension; and Kidney stones.    He  reports that he has been smoking Cigarettes.  He has been smoking about 0.25 packs per day. He has never used smokeless tobacco. He reports that he does not drink alcohol or use drugs. He  reports that he currently engages in sexual activity. The patient  has no past surgical history on file.  His family history includes Cancer in his father.  Review of Systems  Skin: Positive for rash. Negative for itching.    The problem list and medications were reviewed and updated by myself where necessary and exist elsewhere in the encounter.   OBJECTIVE:  BP 137/87 (BP Location: Right Arm, Patient Position: Sitting, Cuff Size: Large)   Pulse 74   Temp 98.1 F (36.7 C) (Oral)   Resp 16   Ht _0  (1.727 m)   Wt 182 lb 9.6 oz (82.8 kg)   SpO2 96%   BMI 27.76 kg/m   BP Readings from Last 3 Encounters:  09/15/16 137/87  11/12/15 138/80  10/27/15 140/90     Physical Exam  Constitutional: He is oriented to person, place, and time.  HENT:  Ears:  Cardiovascular: Normal rate, regular rhythm, S1 normal, S2 normal, normal heart sounds, intact distal pulses and normal pulses.  Exam reveals no gallop and no friction rub.   No murmur heard. Pulmonary/Chest: Effort normal and breath sounds normal. No stridor. No respiratory distress. He has no wheezes. He has no rales.  Abdominal: He exhibits no distension.    Musculoskeletal: Normal range of motion. He exhibits no edema.  Neurological: He is alert and oriented to person, place, and time.  Skin: Skin is warm and dry.    No results found for this or any previous visit (from the past 72 hour(s)).  No results found.    ASSESSMENT AND PLAN:  Breydon was seen today for hypertension.  Diagnoses and all orders for this visit:  Essential hypertension -     TSH -     CBC with Differential/Platelet -     CMP14+EGFR -     EKG 12-Lead -     amLODipine (NORVASC) 5 MG tablet; Take 1 tablet (5 mg total) by mouth daily.  Screening for diabetes mellitus -     Hemoglobin A1c  Suspicious nevus -     Ambulatory referral to Dermatology    The patient is advised to call or return to clinic if he does not see an improvement in symptoms, or to seek the care of the closest emergency department if he worsens with the above plan.   [name], MHS, PA-C [facility] 09/15/2016 4:20 PM

## 2016-09-15 NOTE — Patient Instructions (Signed)
     IF you received an x-ray today, you will receive an invoice from [facility]. Please contact [facility] at [phone] with questions or concerns regarding your invoice.   IF you received labwork today, you will receive an invoice from [facility]. Please contact [facility] at [phone] with questions or concerns regarding your invoice.   Our billing staff will not be able to assist you with questions regarding bills from these companies.  You will be contacted with the lab results as soon as they are available. The fastest way to get your results is to activate your My Chart account. Instructions are located on the last page of this paperwork. If you have not heard from us regarding the results in 2 weeks, please contact this office.     

## 2016-09-16 LAB — CBC WITH DIFFERENTIAL/PLATELET
BASOS: 1 %
Basophils Absolute: 0 10*3/uL (ref 0.0–0.2)
EOS (ABSOLUTE): 0.2 10*3/uL (ref 0.0–0.4)
EOS: 3 %
Hematocrit: 48.1 % (ref 37.5–51.0)
Hemoglobin: 17 g/dL (ref 13.0–17.7)
IMMATURE GRANS (ABS): 0 10*3/uL (ref 0.0–0.1)
IMMATURE GRANULOCYTES: 0 %
LYMPHS: 34 %
Lymphocytes Absolute: 2.2 10*3/uL (ref 0.7–3.1)
MCH: 31.7 pg (ref 26.6–33.0)
MCHC: 35.3 g/dL (ref 31.5–35.7)
MCV: 90 fL (ref 79–97)
MONOS ABS: 0.5 10*3/uL (ref 0.1–0.9)
Monocytes: 8 %
NEUTROS PCT: 54 %
Neutrophils Absolute: 3.4 10*3/uL (ref 1.4–7.0)
PLATELETS: 284 10*3/uL (ref 150–379)
RBC: 5.36 x10E6/uL (ref 4.14–5.80)
RDW: 13.4 % (ref 12.3–15.4)
WBC: 6.3 10*3/uL (ref 3.4–10.8)

## 2016-09-16 LAB — CMP14+EGFR
ALT: 20 IU/L (ref 0–44)
AST: 17 IU/L (ref 0–40)
Albumin/Globulin Ratio: 1.9 (ref 1.2–2.2)
Albumin: 4.5 g/dL (ref 3.5–5.5)
Alkaline Phosphatase: 108 IU/L (ref 39–117)
BUN/Creatinine Ratio: 15 (ref 9–20)
BUN: 15 mg/dL (ref 6–24)
Bilirubin Total: 0.5 mg/dL (ref 0.0–1.2)
CALCIUM: 9.8 mg/dL (ref 8.7–10.2)
CO2: 25 mmol/L (ref 20–29)
CREATININE: 0.98 mg/dL (ref 0.76–1.27)
Chloride: 103 mmol/L (ref 96–106)
GFR calc non Af Amer: 86 mL/min/{1.73_m2} (ref >59)
GFR, EST AFRICAN AMERICAN: 100 mL/min/{1.73_m2} (ref >59)
Globulin, Total: 2.4 g/dL (ref 1.5–4.5)
Glucose: 81 mg/dL (ref 65–99)
Potassium: 4.6 mmol/L (ref 3.5–5.2)
Sodium: 143 mmol/L (ref 134–144)
TOTAL PROTEIN: 6.9 g/dL (ref 6.0–8.5)

## 2016-09-16 LAB — HEMOGLOBIN A1C
ESTIMATED AVERAGE GLUCOSE: 103 mg/dL
Hgb A1c MFr Bld: 5.2 % (ref 4.8–5.6)

## 2016-09-16 LAB — TSH: TSH: 1.73 u[IU]/mL (ref 0.450–4.500)

## 2016-11-25 ENCOUNTER — Telehealth: Payer: Self-pay | Admitting: Physician Assistant

## 2016-11-25 NOTE — Telephone Encounter (Signed)
Pt is needing a refill on amlodipine sent to [facility]   [facility] number [phone]

## 2016-11-25 NOTE — Telephone Encounter (Signed)
Per chart, patient given 90 day supply of medication with 3 refills 08/2016. Call placed to patient to clarify, no answer on patient phone. Left message with information as well as call back number./ S.Ryian Lynde,CMA

## 2017-03-14 ENCOUNTER — Ambulatory Visit: Payer: BLUE CROSS/BLUE SHIELD | Admitting: Physician Assistant

## 2017-04-11 ENCOUNTER — Ambulatory Visit: Payer: BLUE CROSS/BLUE SHIELD | Admitting: Physician Assistant

## 2017-04-18 ENCOUNTER — Ambulatory Visit: Payer: BLUE CROSS/BLUE SHIELD | Admitting: Physician Assistant

## 2017-04-25 ENCOUNTER — Other Ambulatory Visit: Payer: Self-pay

## 2017-04-25 ENCOUNTER — Ambulatory Visit (INDEPENDENT_AMBULATORY_CARE_PROVIDER_SITE_OTHER): Payer: BLUE CROSS/BLUE SHIELD | Admitting: Physician Assistant

## 2017-04-25 ENCOUNTER — Encounter: Payer: Self-pay | Admitting: Physician Assistant

## 2017-04-25 VITALS — BP 128/64 | HR 96 | Temp 97.9°F | Resp 16 | Ht 68.0 in | Wt 185.4 lb

## 2017-04-25 DIAGNOSIS — F17200 Nicotine dependence, unspecified, uncomplicated: Secondary | ICD-10-CM

## 2017-04-25 DIAGNOSIS — I10 Essential (primary) hypertension: Secondary | ICD-10-CM | POA: Diagnosis not present

## 2017-04-25 MED ORDER — AMLODIPINE BESYLATE 5 MG PO TABS: 5.0000 mg | ORAL_TABLET | Freq: Every day | ORAL | 3 refills | Status: AC

## 2017-04-25 NOTE — Patient Instructions (Addendum)
See you back in a year.  Please stop smoking.  Start with 21 mg patches for one month, then go to the 14 for a month, and then the 7 for a month.     IF you received an x-ray today, you will receive an invoice from [facility]. Please contact [facility] at [phone] with questions or concerns regarding your invoice.   IF you received labwork today, you will receive an invoice from [facility]. Please contact [facility] at [phone] with questions or concerns regarding your invoice.   Our billing staff will not be able to assist you with questions regarding bills from these companies.  You will be contacted with the lab results as soon as they are available. The fastest way to get your results is to activate your My Chart account. Instructions are located on the last page of this paperwork. If you have not heard from [place] regarding the results in 2 weeks, please contact this office.

## 2017-04-25 NOTE — Progress Notes (Signed)
04/25/2017 5:14 PM   DOB: 01-18-1961 / MRN: [id number]  SUBJECTIVE:  Theodore Greene is a 57 y.o. male presenting for refills of blood pressure medication.  He tells me he feels well today.  He denies headache, leg swelling, chest pain, shortness of breath, new DOE.  He does not want me to check his lipid panel today.  He is a current smoker and has been smoking for many years.  He smokes about a pack a day.  He does not want to be placed on cholesterol medicine.  He is allergic to doxycycline.   He  has a past medical history of Allergy, Hypertension, and Kidney stones.    He  reports that he has been smoking cigarettes.  He has been smoking about 0.25 packs per day. he has never used smokeless tobacco. He reports that he does not drink alcohol or use drugs. He  reports that he currently engages in sexual activity. The patient  has no past surgical history on file.  His family history includes Cancer in his father.  Review of Systems  Constitutional: Negative for chills, diaphoresis and fever.  Eyes: Negative.   Respiratory: Negative for cough, hemoptysis, sputum production, shortness of breath and wheezing.   Cardiovascular: Negative for chest pain, orthopnea and leg swelling.  Gastrointestinal: Negative for abdominal pain, blood in stool, constipation, diarrhea, heartburn, melena, nausea and vomiting.  Genitourinary: Negative for dysuria, flank pain, frequency, hematuria and urgency.  Skin: Negative for rash.  Neurological: Negative for dizziness, sensory change, speech change, focal weakness and headaches.    The problem list and medications were reviewed and updated by myself where necessary and exist elsewhere in the encounter.   OBJECTIVE:  BP 128/64 (BP Location: Left Arm, Patient Position: Sitting, Cuff Size: Large)   Pulse 96   Temp 97.9 F (36.6 C) (Oral)   Resp 16   Ht 5\' 8"  (1.727 m)   Wt 185 lb 6.4 oz (84.1 kg)   SpO2 99%   BMI 28.19 kg/m   BP Readings from  Last 3 Encounters:  04/25/17 128/64  09/15/16 137/87  11/12/15 138/80   Wt Readings from Last 3 Encounters:  04/25/17 185 lb 6.4 oz (84.1 kg)  09/15/16 182 lb 9.6 oz (82.8 kg)  11/12/15 178 lb 9.6 oz (81 kg)   BP Readings from Last 3 Encounters:  04/25/17 128/64  09/15/16 137/87  11/12/15 138/80   Lab Results  Component Value Date   WBC 6.3 09/15/2016   HGB 17.0 09/15/2016   HCT 48.1 09/15/2016   MCV 90 09/15/2016   PLT 284 09/15/2016   Lab Results  Component Value Date   CREATININE 0.98 09/15/2016   BUN 15 09/15/2016   NA 143 09/15/2016   K 4.6 09/15/2016   CL 103 09/15/2016   CO2 25 09/15/2016   Lab Results  Component Value Date   ALT 20 09/15/2016   AST 17 09/15/2016   ALKPHOS 108 09/15/2016   BILITOT 0.5 09/15/2016   Lab Results  Component Value Date   TSH 1.730 09/15/2016   Lab Results  Component Value Date   HGBA1C 5.2 09/15/2016    Physical Exam  Constitutional: He appears well-developed. He is active and cooperative.  Non-toxic appearance.  Cardiovascular: Normal rate, regular rhythm, S1 normal, S2 normal, normal heart sounds, intact distal pulses and normal pulses. Exam reveals no gallop and no friction rub.  No murmur heard. Pulmonary/Chest: Effort normal. No stridor. No tachypnea. No respiratory distress. He  has no wheezes. He has no rales.  Abdominal: He exhibits no distension.  Musculoskeletal: He exhibits no edema.  Neurological: He is alert.  Skin: Skin is warm and dry. He is not diaphoretic. No pallor.  Vitals reviewed.   No results found for this or any previous visit (from the past 72 hour(s)).  No results found.  ASSESSMENT AND PLAN:  Slayter was seen today for medication refill.  Diagnoses and all orders for this visit:  Smoker he tells me that he will try some patches.  I had a long discussion with him today: And his ASCVD score is roughly 14.  I showed him on a calculator that by stopping smoking he would cut his risk for  heart attack or stroke by half.  I hope he will try to quit.  I will see him back in a year for medication refills sooner if he needs anything.  I have advised that he needs a physical and would be happy to perform this for him at any time.  Essential hypertension: Well-controlled.  He does not want labs today because he is a self-pay patient despite what epic says today. -     amLODipine (NORVASC) 5 MG tablet; Take 1 tablet (5 mg total) by mouth daily.    The patient is advised to call or return to clinic if he does not see an improvement in symptoms, or to seek the care of the closest emergency department if he worsens with the above plan.   [name], MHS, PA-C [facility] 04/25/2017 5:14 PM

## 2018-02-20 IMAGING — CT CT ABD-PELV W/ CM
2 of 5 series · 14 of 46 positions shown, 16 images · IV contrast (ISOVUE)
Comparison: None.

CLINICAL DATA: Left lower quadrant abdominal pain. Intermittent
fever and nausea. History of hypertension and renal stones.

EXAM:
CT ABDOMEN AND PELVIS WITH CONTRAST
TECHNIQUE: Multidetector CT imaging of the abdomen and pelvis was performed
using the standard protocol following bolus administration of
intravenous contrast.
CONTRAST:  100mL [id number] IOPAMIDOL ([id number]) INJECTION 61%

[Series 2: abd/pel with · axial · 0.88mm/px · z∈[-381,+9]mm · 11 of 90 slices shown, 13 images]
[im 6/90  soft-tissue]
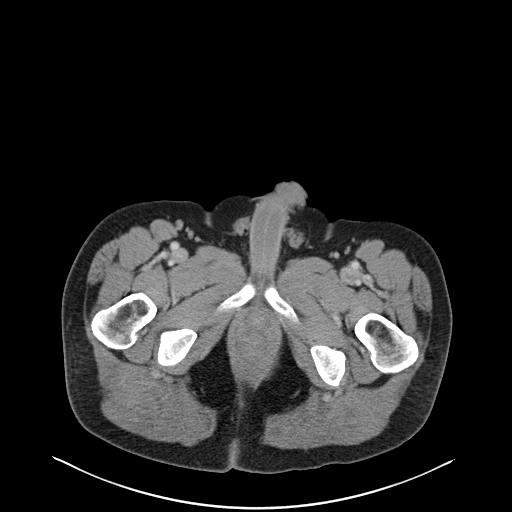
[im 6/90  bone]
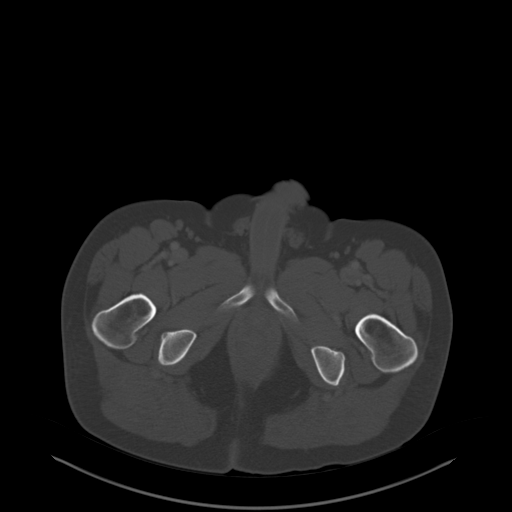
[im 17/90  soft-tissue]
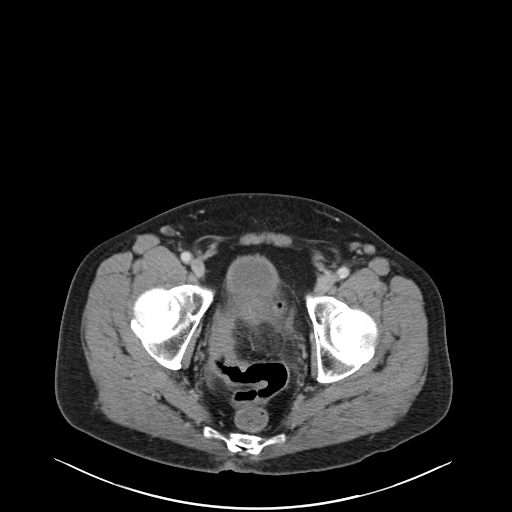
[im 23/90  soft-tissue]
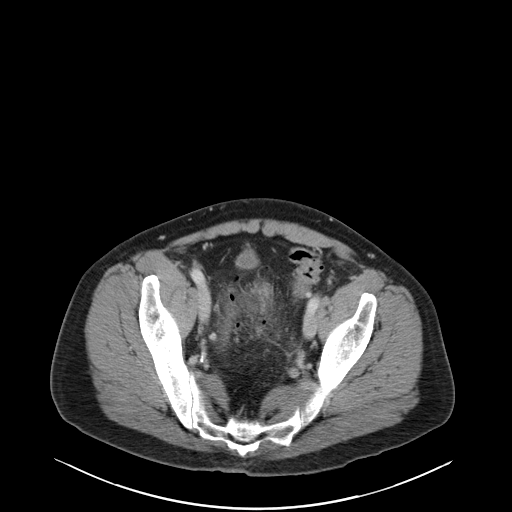
[im 28/90  soft-tissue]
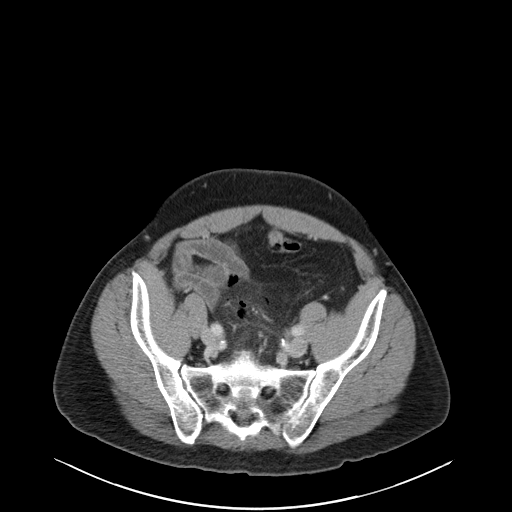
[im 39/90  soft-tissue]
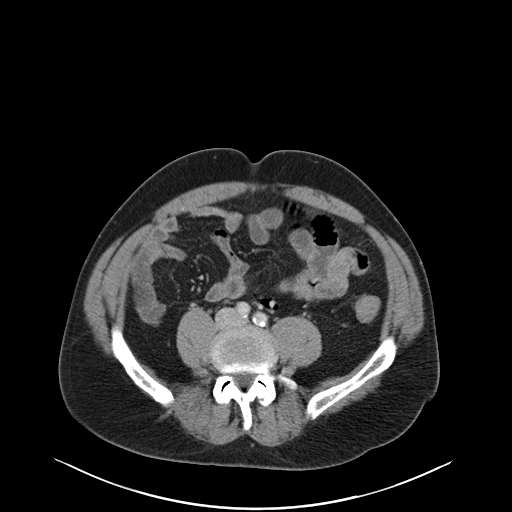
[im 45/90  soft-tissue]
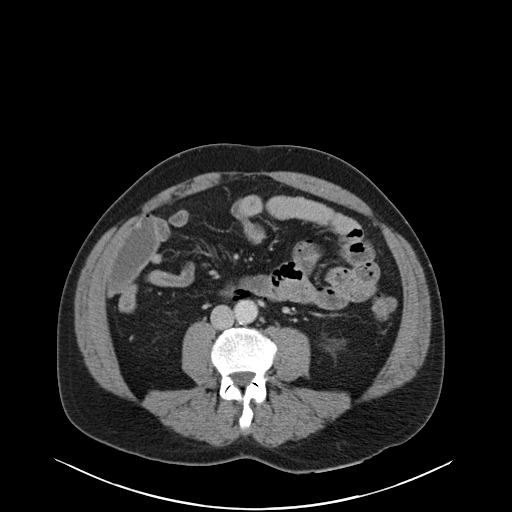
[im 51/90  soft-tissue]
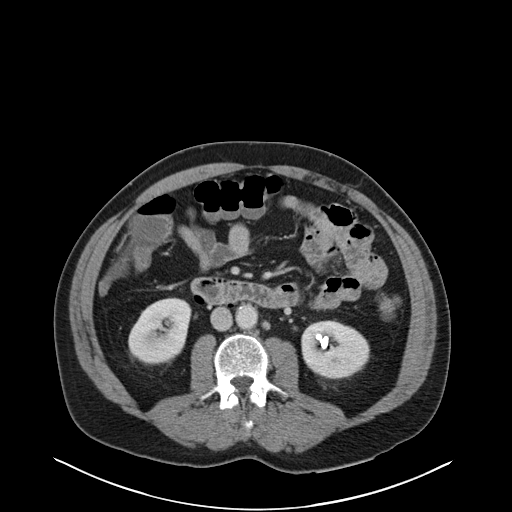
[im 62/90  soft-tissue]
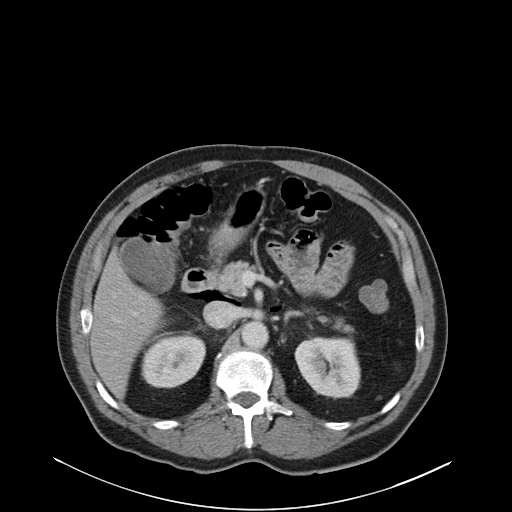
[im 67/90  soft-tissue]
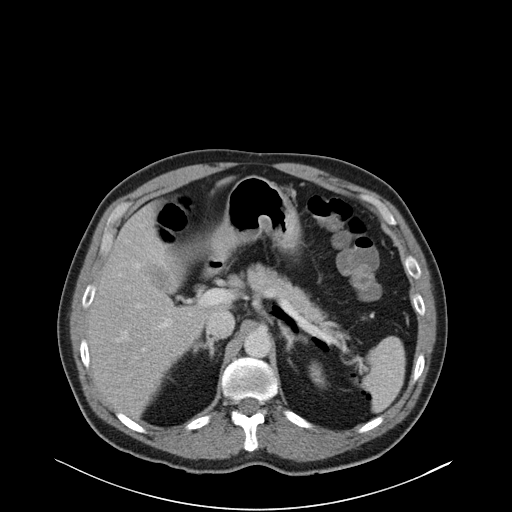
[im 67/90  bone]
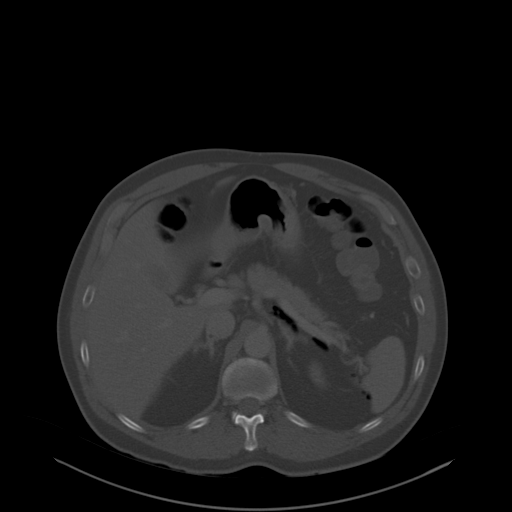
[im 73/90  soft-tissue]
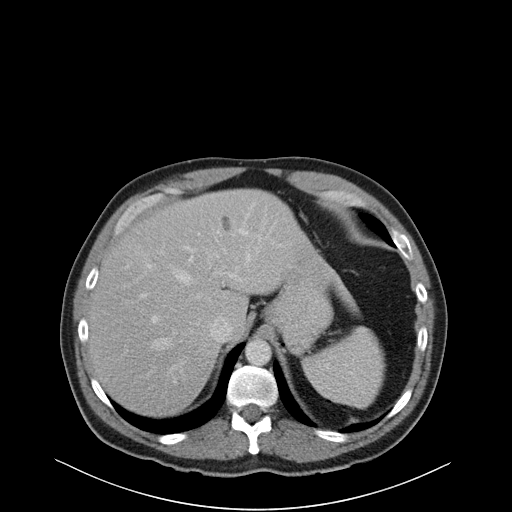
[im 84/90  soft-tissue]
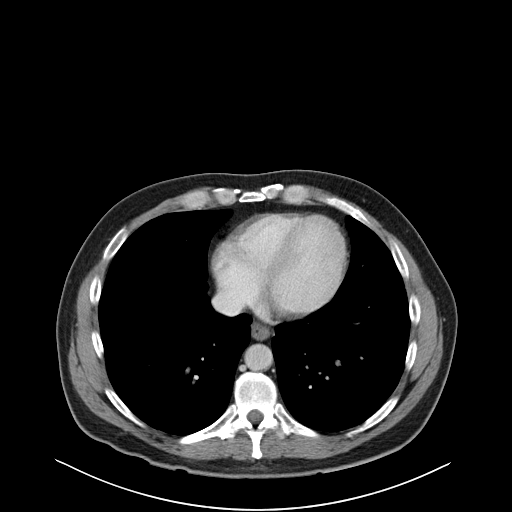

[Series 5: coronal a/|p · coronal · 0.74mm/px · 3 of 157 slices shown]
[im 53/157  soft-tissue]
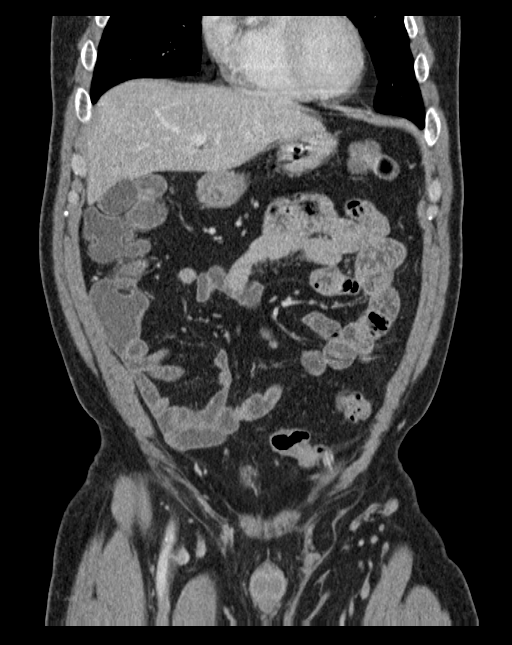
[im 70/157  soft-tissue]
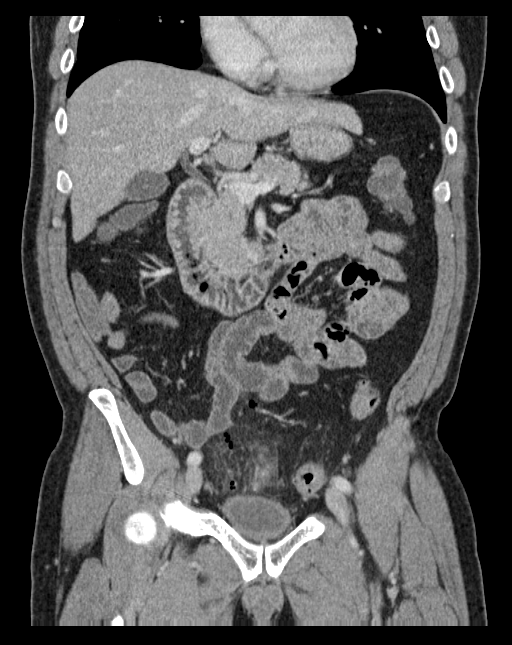
[im 87/157  soft-tissue]
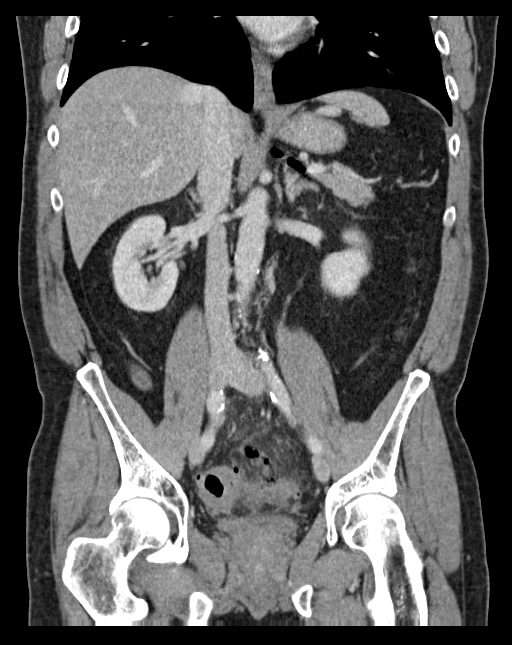

[14 of 46 positions shown; findings below may reference images not displayed]

FINDINGS: Lower chest: Limited visualization of the lower thorax is negative
for focal airspace opacity or pleural effusion.

Normal heart size.  No pericardial effusion.

Hepatobiliary: Normal hepatic contour. Punctate (approximately
cm) hypo attenuating lesion within the dome of the left lobe of the
liver (image 17, series 2) as well as an approximately 3 mm hypo
attenuating lesion within the caudal aspect of the lateral segment
of the left lobe of the liver (image 22, series 2) are both too
small to accurately characterize though favored to represent hepatic
cysts. Normal appearance of the gallbladder given degree distention.
No radiopaque gallstones. No intra or extrahepatic biliary duct
dilatation. No ascites.

Pancreas: Normal appearance of the pancreas

Spleen:  Normal appearance of the spleen

Adrenals/Urinary Tract: There is symmetric enhancement and excretion
of the bilateral kidneys. There are 2 punctate nonobstructing stones
within the inferior pole of the left kidney with dominant stone
measuring 0.9 cm in greatest short axis diameter. No evidence of
right-sided nephrolithiasis. No discrete renal lesions. No urinary
obstruction. Normal appearance of the bilateral adrenal glands.
Normal appearance of the urinary bladder given degree distention

Stomach/Bowel: Extensive colonic diverticulosis with short segment
wall thickening involving the sigmoid colon within the midline of
the lower pelvis. This finding is associated with adjacent
ill-defined mesenteric stranding and scattered foci of adjacent
pneumoperitoneum which extend along the retroperitoneum to the level
of the left upper abdominal quadrant and adjacent to the spleen. In
spite of this enteric perforation, there are no definable/drainable
fluid collections within the abdomen or pelvis.

The cecum is noted to be located within the right mid hemi abdomen.
Normal appearance of the terminal ileum and appendix. No pneumatosis
or portal venous gas.

Vascular/Lymphatic: Moderate amount of eccentric mixed calcified and
noncalcified atherosclerotic plaque within a normal caliber
abdominal aorta. The major branch vessels of the abdominal aorta
appear patent on this non CTA examination.

No bulky retroperitoneal, mesenteric, pelvic or inguinal
lymphadenopathy.

Reproductive: Normal appearance of the prostate gland. No free fluid
in the pelvic cul-de-sac.

Other: Small bilateral mesenteric fat containing indirect inguinal
hernias

Musculoskeletal: No acute or aggressive osseous abnormalities. An
intraosseous hemangioma is noted within the L4 vertebral body
IMPRESSION: 1. Findings compatible with acute perforated sigmoid diverticulitis
with small to moderate amount of pneumoperitoneum extending along
the retroperitoneum to the level of the left upper abdominal
quadrant adjacent to the spleen. In spite of this enteric
perforation, there is no definable/drainable fluid collection. No
evidence of enteric obstruction.
2. Nonobstructing left-sided nephrolithiasis.
3.  Aortic Atherosclerosis (YJD6U-170.0)
Critical Value/emergent results were called by telephone at the time
of interpretation on 10/21/2015 at [DATE] to Dr. DINORAH ATTAWAY , who
verbally acknowledged these results.

## 2018-06-10 ENCOUNTER — Other Ambulatory Visit: Payer: Self-pay | Admitting: Physician Assistant

## 2018-06-10 DIAGNOSIS — I10 Essential (primary) hypertension: Secondary | ICD-10-CM

## 2018-06-13 ENCOUNTER — Telehealth: Payer: Self-pay | Admitting: General Practice

## 2018-06-13 NOTE — Telephone Encounter (Signed)
Copied from [place] [id number]. Topic: Quick Communication - Rx Refill/Question >> Jun 13, 2018  4:32 PM Gustavus Messing wrote: Medication: amLODipine (NORVASC) 5 MG tablet    Patient has no refills and the Dr. Geraldine Solar him to be seen but there are no appointment   Preferred Pharmacy (with phone number or street name): [id number] - [place], [place] [phone] (Phone) [phone] (Fax)    Agent: Please be advised that RX refills may take up to 3 business days. We ask that you follow-up with your pharmacy.

## 2018-06-13 NOTE — Telephone Encounter (Signed)
Called pt to get apt with Dr. Pamella Pert at 68 tomorrow unfortunately we can't feel his prescription until he is seen per chanda.

## 2018-06-14 ENCOUNTER — Ambulatory Visit: Payer: BLUE CROSS/BLUE SHIELD | Admitting: Family Medicine

## 2018-08-31 DIAGNOSIS — I10 Essential (primary) hypertension: Secondary | ICD-10-CM | POA: Diagnosis not present

## 2018-08-31 DIAGNOSIS — Z720 Tobacco use: Secondary | ICD-10-CM | POA: Diagnosis not present

## 2018-08-31 DIAGNOSIS — M2142 Flat foot [pes planus] (acquired), left foot: Secondary | ICD-10-CM | POA: Diagnosis not present

## 2018-08-31 DIAGNOSIS — M2141 Flat foot [pes planus] (acquired), right foot: Secondary | ICD-10-CM | POA: Diagnosis not present

## 2018-09-22 DIAGNOSIS — M2550 Pain in unspecified joint: Secondary | ICD-10-CM | POA: Diagnosis not present

## 2018-09-22 DIAGNOSIS — Z125 Encounter for screening for malignant neoplasm of prostate: Secondary | ICD-10-CM | POA: Diagnosis not present

## 2018-09-22 DIAGNOSIS — Z1211 Encounter for screening for malignant neoplasm of colon: Secondary | ICD-10-CM | POA: Diagnosis not present

## 2018-09-22 DIAGNOSIS — Z13220 Encounter for screening for lipoid disorders: Secondary | ICD-10-CM | POA: Diagnosis not present

## 2018-09-22 DIAGNOSIS — Z0000 Encounter for general adult medical examination without abnormal findings: Secondary | ICD-10-CM | POA: Diagnosis not present

## 2018-10-10 DIAGNOSIS — M216X1 Other acquired deformities of right foot: Secondary | ICD-10-CM | POA: Insufficient documentation

## 2018-10-10 DIAGNOSIS — L84 Corns and callosities: Secondary | ICD-10-CM | POA: Diagnosis not present

## 2018-12-15 DIAGNOSIS — D125 Benign neoplasm of sigmoid colon: Secondary | ICD-10-CM | POA: Diagnosis not present

## 2018-12-15 DIAGNOSIS — K635 Polyp of colon: Secondary | ICD-10-CM | POA: Diagnosis not present

## 2018-12-15 DIAGNOSIS — D122 Benign neoplasm of ascending colon: Secondary | ICD-10-CM | POA: Diagnosis not present

## 2018-12-15 DIAGNOSIS — Z1211 Encounter for screening for malignant neoplasm of colon: Secondary | ICD-10-CM | POA: Diagnosis not present

## 2019-01-18 DIAGNOSIS — M216X1 Other acquired deformities of right foot: Secondary | ICD-10-CM | POA: Diagnosis not present

## 2019-01-18 DIAGNOSIS — L84 Corns and callosities: Secondary | ICD-10-CM | POA: Diagnosis not present

## 2019-01-31 DIAGNOSIS — J321 Chronic frontal sinusitis: Secondary | ICD-10-CM | POA: Diagnosis not present

## 2020-04-02 DIAGNOSIS — S93401A Sprain of unspecified ligament of right ankle, initial encounter: Secondary | ICD-10-CM | POA: Diagnosis not present

## 2020-04-02 DIAGNOSIS — Z720 Tobacco use: Secondary | ICD-10-CM | POA: Diagnosis not present

## 2020-04-02 DIAGNOSIS — I10 Essential (primary) hypertension: Secondary | ICD-10-CM | POA: Diagnosis not present

## 2020-04-02 DIAGNOSIS — E785 Hyperlipidemia, unspecified: Secondary | ICD-10-CM | POA: Diagnosis not present

## 2020-04-11 DIAGNOSIS — M4316 Spondylolisthesis, lumbar region: Secondary | ICD-10-CM | POA: Diagnosis not present

## 2020-04-11 DIAGNOSIS — M5416 Radiculopathy, lumbar region: Secondary | ICD-10-CM | POA: Diagnosis not present

## 2020-06-10 DIAGNOSIS — M4316 Spondylolisthesis, lumbar region: Secondary | ICD-10-CM | POA: Diagnosis not present

## 2020-07-02 DIAGNOSIS — M6281 Muscle weakness (generalized): Secondary | ICD-10-CM | POA: Diagnosis not present

## 2020-07-02 DIAGNOSIS — M5489 Other dorsalgia: Secondary | ICD-10-CM | POA: Diagnosis not present

## 2020-07-02 DIAGNOSIS — R2689 Other abnormalities of gait and mobility: Secondary | ICD-10-CM | POA: Diagnosis not present

## 2020-07-02 DIAGNOSIS — M546 Pain in thoracic spine: Secondary | ICD-10-CM | POA: Diagnosis not present

## 2020-07-09 DIAGNOSIS — M546 Pain in thoracic spine: Secondary | ICD-10-CM | POA: Diagnosis not present

## 2020-07-09 DIAGNOSIS — M5489 Other dorsalgia: Secondary | ICD-10-CM | POA: Diagnosis not present

## 2020-07-09 DIAGNOSIS — M6281 Muscle weakness (generalized): Secondary | ICD-10-CM | POA: Diagnosis not present

## 2020-07-09 DIAGNOSIS — R2689 Other abnormalities of gait and mobility: Secondary | ICD-10-CM | POA: Diagnosis not present

## 2021-04-02 DIAGNOSIS — H35341 Macular cyst, hole, or pseudohole, right eye: Secondary | ICD-10-CM | POA: Diagnosis not present

## 2021-04-13 DIAGNOSIS — R0981 Nasal congestion: Secondary | ICD-10-CM | POA: Diagnosis not present

## 2021-04-13 DIAGNOSIS — R059 Cough, unspecified: Secondary | ICD-10-CM | POA: Diagnosis not present

## 2021-04-13 DIAGNOSIS — J0100 Acute maxillary sinusitis, unspecified: Secondary | ICD-10-CM | POA: Diagnosis not present

## 2021-04-15 DIAGNOSIS — H3589 Other specified retinal disorders: Secondary | ICD-10-CM | POA: Diagnosis not present

## 2021-07-14 DIAGNOSIS — R0600 Dyspnea, unspecified: Secondary | ICD-10-CM | POA: Diagnosis not present

## 2021-07-14 DIAGNOSIS — R079 Chest pain, unspecified: Secondary | ICD-10-CM | POA: Diagnosis not present

## 2021-07-14 DIAGNOSIS — F17210 Nicotine dependence, cigarettes, uncomplicated: Secondary | ICD-10-CM | POA: Diagnosis not present

## 2021-07-14 DIAGNOSIS — Z79899 Other long term (current) drug therapy: Secondary | ICD-10-CM | POA: Diagnosis not present

## 2021-07-14 DIAGNOSIS — R069 Unspecified abnormalities of breathing: Secondary | ICD-10-CM | POA: Diagnosis not present

## 2021-07-14 DIAGNOSIS — I10 Essential (primary) hypertension: Secondary | ICD-10-CM | POA: Diagnosis not present

## 2021-07-14 DIAGNOSIS — R0789 Other chest pain: Secondary | ICD-10-CM | POA: Diagnosis not present

## 2021-07-14 DIAGNOSIS — R0602 Shortness of breath: Secondary | ICD-10-CM | POA: Diagnosis not present

## 2021-07-15 DIAGNOSIS — I10 Essential (primary) hypertension: Secondary | ICD-10-CM | POA: Diagnosis not present

## 2021-07-15 DIAGNOSIS — R0602 Shortness of breath: Secondary | ICD-10-CM | POA: Diagnosis not present

## 2021-07-15 DIAGNOSIS — R0600 Dyspnea, unspecified: Secondary | ICD-10-CM | POA: Diagnosis not present

## 2021-07-15 DIAGNOSIS — R079 Chest pain, unspecified: Secondary | ICD-10-CM | POA: Diagnosis not present

## 2021-07-15 DIAGNOSIS — R0789 Other chest pain: Secondary | ICD-10-CM | POA: Diagnosis not present

## 2021-07-28 DIAGNOSIS — I10 Essential (primary) hypertension: Secondary | ICD-10-CM | POA: Diagnosis not present

## 2021-07-28 DIAGNOSIS — Z720 Tobacco use: Secondary | ICD-10-CM | POA: Diagnosis not present

## 2021-07-28 DIAGNOSIS — F419 Anxiety disorder, unspecified: Secondary | ICD-10-CM | POA: Diagnosis not present

## 2021-07-28 DIAGNOSIS — I700 Atherosclerosis of aorta: Secondary | ICD-10-CM | POA: Diagnosis not present

## 2021-07-30 DIAGNOSIS — H04123 Dry eye syndrome of bilateral lacrimal glands: Secondary | ICD-10-CM | POA: Diagnosis not present

## 2021-09-02 DIAGNOSIS — R0600 Dyspnea, unspecified: Secondary | ICD-10-CM | POA: Diagnosis not present

## 2021-09-02 DIAGNOSIS — G4733 Obstructive sleep apnea (adult) (pediatric): Secondary | ICD-10-CM | POA: Diagnosis not present

## 2021-09-02 DIAGNOSIS — F17210 Nicotine dependence, cigarettes, uncomplicated: Secondary | ICD-10-CM | POA: Diagnosis not present

## 2021-09-02 DIAGNOSIS — R5383 Other fatigue: Secondary | ICD-10-CM | POA: Diagnosis not present

## 2021-11-13 DIAGNOSIS — G4733 Obstructive sleep apnea (adult) (pediatric): Secondary | ICD-10-CM | POA: Diagnosis not present

## 2021-11-18 DIAGNOSIS — R5383 Other fatigue: Secondary | ICD-10-CM | POA: Diagnosis not present

## 2021-11-18 DIAGNOSIS — G4733 Obstructive sleep apnea (adult) (pediatric): Secondary | ICD-10-CM | POA: Diagnosis not present

## 2021-11-18 DIAGNOSIS — J301 Allergic rhinitis due to pollen: Secondary | ICD-10-CM | POA: Diagnosis not present

## 2021-11-18 DIAGNOSIS — F17210 Nicotine dependence, cigarettes, uncomplicated: Secondary | ICD-10-CM | POA: Diagnosis not present

## 2021-11-18 DIAGNOSIS — R0600 Dyspnea, unspecified: Secondary | ICD-10-CM | POA: Diagnosis not present

## 2021-12-11 DIAGNOSIS — G4733 Obstructive sleep apnea (adult) (pediatric): Secondary | ICD-10-CM | POA: Diagnosis not present

## 2021-12-17 DIAGNOSIS — G4733 Obstructive sleep apnea (adult) (pediatric): Secondary | ICD-10-CM | POA: Diagnosis not present

## 2021-12-17 DIAGNOSIS — I10 Essential (primary) hypertension: Secondary | ICD-10-CM | POA: Diagnosis not present

## 2021-12-17 DIAGNOSIS — E785 Hyperlipidemia, unspecified: Secondary | ICD-10-CM | POA: Diagnosis not present

## 2021-12-17 DIAGNOSIS — Z720 Tobacco use: Secondary | ICD-10-CM | POA: Diagnosis not present

## 2021-12-30 DIAGNOSIS — G4733 Obstructive sleep apnea (adult) (pediatric): Secondary | ICD-10-CM | POA: Diagnosis not present

## 2021-12-30 DIAGNOSIS — R5383 Other fatigue: Secondary | ICD-10-CM | POA: Diagnosis not present

## 2021-12-30 DIAGNOSIS — J301 Allergic rhinitis due to pollen: Secondary | ICD-10-CM | POA: Diagnosis not present

## 2021-12-30 DIAGNOSIS — F17210 Nicotine dependence, cigarettes, uncomplicated: Secondary | ICD-10-CM | POA: Diagnosis not present

## 2021-12-30 DIAGNOSIS — R0600 Dyspnea, unspecified: Secondary | ICD-10-CM | POA: Diagnosis not present

## 2022-01-06 DIAGNOSIS — R0600 Dyspnea, unspecified: Secondary | ICD-10-CM | POA: Diagnosis not present

## 2022-01-06 DIAGNOSIS — G4733 Obstructive sleep apnea (adult) (pediatric): Secondary | ICD-10-CM | POA: Diagnosis not present

## 2024-02-13 ENCOUNTER — Ambulatory Visit (INDEPENDENT_AMBULATORY_CARE_PROVIDER_SITE_OTHER): Payer: Self-pay | Admitting: Dermatology

## 2024-02-13 ENCOUNTER — Encounter: Payer: Self-pay | Admitting: Dermatology

## 2024-02-13 DIAGNOSIS — D229 Melanocytic nevi, unspecified: Secondary | ICD-10-CM

## 2024-02-13 DIAGNOSIS — W908XXA Exposure to other nonionizing radiation, initial encounter: Secondary | ICD-10-CM

## 2024-02-13 DIAGNOSIS — L578 Other skin changes due to chronic exposure to nonionizing radiation: Secondary | ICD-10-CM

## 2024-02-13 DIAGNOSIS — L814 Other melanin hyperpigmentation: Secondary | ICD-10-CM

## 2024-02-13 DIAGNOSIS — L821 Other seborrheic keratosis: Secondary | ICD-10-CM

## 2024-02-13 DIAGNOSIS — L570 Actinic keratosis: Secondary | ICD-10-CM

## 2024-02-13 DIAGNOSIS — Z1283 Encounter for screening for malignant neoplasm of skin: Secondary | ICD-10-CM

## 2024-02-13 DIAGNOSIS — D1801 Hemangioma of skin and subcutaneous tissue: Secondary | ICD-10-CM

## 2024-02-13 MED ORDER — FLUOROURACIL 5 % EX CREA: TOPICAL_CREAM | Freq: Two times a day (BID) | CUTANEOUS | 1 refills | Status: AC

## 2024-02-13 NOTE — Patient Instructions (Addendum)
 Seborrheic Keratosis  What causes seborrheic keratoses? Seborrheic keratoses are harmless, common skin growths that first appear during adult life.  As time goes by, more growths appear.  Some people may develop a large number of them.  Seborrheic keratoses appear on both covered and uncovered body parts.  They are not caused by sunlight.  The tendency to develop seborrheic keratoses can be inherited.  They vary in color from skin-colored to gray, brown, or even black.  They can be either smooth or have a rough, warty surface.   Seborrheic keratoses are superficial and look as if they were stuck on the skin.  Under the microscope this type of keratosis looks like layers upon layers of skin.  That is why at times the top layer may seem to fall off, but the rest of the growth remains and re-grows.    Treatment Seborrheic keratoses do not need to be treated, but can easily be removed in the office.  Seborrheic keratoses often cause symptoms when they rub on clothing or jewelry.  Lesions can be in the way of shaving.  If they become inflamed, they can cause itching, soreness, or burning.  Removal of a seborrheic keratosis can be accomplished by freezing, burning, or surgery. If any spot bleeds, scabs, or grows rapidly, please return to have it checked, as these can be an indication of a skin cancer.  - Start 5-fluorouracil cream until red and irritated to affected areas including tops of ears, left forehead.  Reviewed course of treatment and expected reaction.  Patient advised to expect inflammation and crusting and advised that erosions are possible.  Patient advised to be diligent with sun protection during and after treatment. Handout with details of how to apply medication and what to expect provided. Counseled to keep medication out of reach of children and pets.  Melanoma ABCDEs  Melanoma is the most dangerous type of skin cancer, and is the leading cause of death from skin disease.  You are more  likely to develop melanoma if you: Have light-colored skin, light-colored eyes, or red or blond hair Spend a lot of time in the sun Tan regularly, either outdoors or in a tanning bed Have had blistering sunburns, especially during childhood Have a close family member who has had a melanoma Have atypical moles or large birthmarks  Early detection of melanoma is key since treatment is typically straightforward and cure rates are extremely high if we catch it early.   The first sign of melanoma is often a change in a mole or a new dark spot.  The ABCDE system is a way of remembering the signs of melanoma.  A for asymmetry:  The two halves do not match. B for border:  The edges of the growth are irregular. C for color:  A mixture of colors are present instead of an even brown color. D for diameter:  Melanomas are usually (but not always) greater than 6mm - the size of a pencil eraser. E for evolution:  The spot keeps changing in size, shape, and color.  Please check your skin once per month between visits. You can use a small mirror in front and a large mirror behind you to keep an eye on the back side or your body.   If you see any new or changing lesions before your next follow-up, please call to schedule a visit.  Please continue daily skin protection including broad spectrum sunscreen SPF 30+ to sun-exposed areas, reapplying every 2 hours as needed  when you're outdoors.    Due to recent changes in healthcare laws, you may see results of your pathology and/or laboratory studies on MyChart before the doctors have had a chance to review them. We understand that in some cases there may be results that are confusing or concerning to you. Please understand that not all results are received at the same time and often the doctors may need to interpret multiple results in order to provide you with the best plan of care or course of treatment. Therefore, we ask that you please give us  2 business days  to thoroughly review all your results before contacting the office for clarification. Should we see a critical lab result, you will be contacted sooner.   If You Need Anything After Your Visit  If you have any questions or concerns for your doctor, please call our main line at [phone] and press option 4 to reach your doctor's medical assistant. If no one answers, please leave a voicemail as directed and we will return your call as soon as possible. Messages left after 4 pm will be answered the following business day.   You may also send us  a message via MyChart. We typically respond to MyChart messages within 1-2 business days.  For prescription refills, please ask your pharmacy to contact our office. Our fax number is [phone].  If you have an urgent issue when the clinic is closed that cannot wait until the next business day, you can page your doctor at the number below.    Please note that while we do our best to be available for urgent issues outside of office hours, we are not available 24/7.   If you have an urgent issue and are unable to reach us , you may choose to seek medical care at your doctor's office, retail clinic, urgent care center, or emergency room.  If you have a medical emergency, please immediately call 911 or go to the emergency department.  Pager Numbers  - Dr. Hester: [phone]  - Dr. Jackquline: [phone]  - Dr. Claudene: [phone]   - Dr. Raymund: [phone]  In the event of inclement weather, please call our main line at [phone] for an update on the status of any delays or closures.  Dermatology Medication Tips: Please keep the boxes that topical medications come in in order to help keep track of the instructions about where and how to use these. Pharmacies typically print the medication instructions only on the boxes and not directly on the medication tubes.   If your medication is too expensive, please contact our office at  [phone] option 4 or send us  a message through MyChart.   We are unable to tell what your co-pay for medications will be in advance as this is different depending on your insurance coverage. However, we may be able to find a substitute medication at lower cost or fill out paperwork to get insurance to cover a needed medication.   If a prior authorization is required to get your medication covered by your insurance company, please allow us  1-2 business days to complete this process.  Drug prices often vary depending on where the prescription is filled and some pharmacies may offer cheaper prices.  The website www.goodrx.com contains coupons for medications through different pharmacies. The prices here do not account for what the cost may be with help from insurance (it may be cheaper with your insurance), but the website can give you the price if you did not use any  insurance.  - You can print the associated coupon and take it with your prescription to the pharmacy.  - You may also stop by our office during regular business hours and pick up a GoodRx coupon card.  - If you need your prescription sent electronically to a different pharmacy, notify our office through [facility] or by phone at [phone] option 4.     Si Usted Necesita Algo Despus de Su Visita  Tambin puede enviarnos un mensaje a travs de Clinical Cytogeneticist. Por lo general respondemos a los mensajes de MyChart en el transcurso de 1 a 2 das hbiles.  Para renovar recetas, por favor pida a su farmacia que se ponga en contacto con nuestra oficina. Randi lakes de fax es [place] [phone].  Si tiene un asunto urgente cuando la clnica est cerrada y que no puede esperar hasta el siguiente da hbil, puede llamar/localizar a su doctor(a) al nmero que aparece a continuacin.   Por favor, tenga en cuenta que aunque hacemos todo lo posible para estar disponibles para asuntos urgentes fuera del horario de [place], no estamos  disponibles las 24 horas del da, los 7 [address]  [address] de la [place].   Si tiene un problema urgente y no puede comunicarse con nosotros, puede optar por buscar atencin mdica  en el consultorio de su doctor(a), en una clnica privada, en un centro de atencin urgente o en una sala de emergencias.  Si tiene engineer, drilling, por favor llame inmediatamente al 911 o vaya a la sala de emergencias.  Nmeros de bper  - Dr. Hester: [phone]  - Dra. Jackquline: [phone]  - Dr. Claudene: [phone]  - Dra. Kitts: [phone]  En caso de inclemencias del [place], por favor llame a nuestra lnea principal al [phone] para una actualizacin sobre el estado de cualquier retraso o cierre.  Consejos para la medicacin en dermatologa: Por favor, guarde las cajas en las que vienen los medicamentos de uso tpico para ayudarle a seguir las instrucciones sobre dnde y cmo usarlos. Las farmacias generalmente imprimen las instrucciones del medicamento slo en las cajas y no directamente en los tubos del [place].   Si su medicamento es muy caro, por favor, pngase en contacto con landry rieger llamando al [phone] y presione la opcin 4 o envenos un mensaje a travs de Clinical Cytogeneticist.   No podemos decirle cul ser su copago por los medicamentos por adelantado ya que esto es diferente dependiendo de la cobertura de su seguro. Sin embargo, es posible que podamos encontrar un medicamento sustituto a audiological scientist un formulario para que el seguro cubra el medicamento que se considera necesario.   Si se requiere una autorizacin previa para que su compaa de seguros cubra su medicamento, por favor permtanos de 1 a 2 das hbiles para completar este proceso.  Los precios de los medicamentos varan con frecuencia dependiendo del environmental consultant de dnde se surte la receta y alguna farmacias pueden ofrecer precios ms baratos.  El sitio web www.goodrx.com tiene cupones para medicamentos de engineer, civil (consulting). Los precios aqu no tienen en cuenta lo que podra costar con la ayuda del seguro (puede ser ms barato con su seguro), pero el sitio web puede darle el precio si no utiliz tourist information centre manager.  - Puede imprimir el cupn correspondiente y llevarlo con su receta a la farmacia.  - Tambin puede pasar por nuestra oficina durante el horario de atencin regular y education officer, museum una tarjeta de cupones de GoodRx.  - Si necesita que su receta  se enve electrnicamente a una farmacia diferente, informe a nuestra oficina a travs de MyChart de [place] o por telfono llamando al [phone] y presione la opcin 4.

## 2024-02-13 NOTE — Progress Notes (Signed)
 New Patient Visit   Subjective  Theodore Greene is a 63 y.o. male who presents for the following: Skin Cancer Screening and Upper Body Skin Exam  The patient presents for Upper Body Skin Exam (UBSE) for skin cancer screening and mole check. The patient has spots, moles and lesions to be evaluated, some may be new or changing and the patient may have concern these could be cancer.  Fhx skin cancer, no personal hx. Patient with a spot at left upper chest, present for about a month, itches. Also with some areas at ears that flake.   The following portions of the chart were reviewed this encounter and updated as appropriate: medications, allergies, medical history  Review of Systems:  No other skin or systemic complaints except as noted in HPI or Assessment and Plan.  Objective  Well appearing patient in no apparent distress; mood and affect are within normal limits.  All skin waist up examined. Relevant physical exam findings are noted in the Assessment and Plan.  L forehead, tops of ears b/l Erythematous thin papules/macules with gritty scale.   Assessment & Plan   AK (ACTINIC KERATOSIS) L forehead, tops of ears b/l Actinic keratoses are precancerous spots that appear secondary to cumulative UV radiation exposure/sun exposure over time. They are chronic with expected duration over 1 year. A portion of actinic keratoses will progress to squamous cell carcinoma of the skin. It is not possible to reliably predict which spots will progress to skin cancer and so treatment is recommended to prevent development of skin cancer.  Recommend daily broad spectrum sunscreen SPF 30+ to sun-exposed areas, reapply every 2 hours as needed.  Recommend staying in the shade or wearing long sleeves, sun glasses (UVA+UVB protection) and wide brim hats (4-inch brim around the entire circumference of the hat). Call for new or changing lesions.   SEBORRHEIC KERATOSES   ACTINIC ELASTOSIS   MULTIPLE  BENIGN NEVI   LENTIGINES   CHERRY ANGIOMA   Skin cancer screening performed today.  ACTINIC DAMAGE WITH PRECANCEROUS ACTINIC KERATOSES Counseling for Topical Chemotherapy Management: Patient exhibits: - Severe, confluent actinic changes with pre-cancerous actinic keratoses that is secondary to cumulative UV radiation exposure over time - Condition that is severe; chronic, not at goal. - diffuse scaly erythematous macules and papules with underlying dyspigmentation - Discussed Prescription Field Treatment topical Chemotherapy for Severe, Chronic Confluent Actinic Changes with Pre-Cancerous Actinic Keratoses Field treatment involves treatment of an entire area of skin that has confluent Actinic Changes (Sun/ Ultraviolet light damage) and PreCancerous Actinic Keratoses by method of PhotoDynamic Therapy (PDT) and/or prescription Topical Chemotherapy agents such as 5-fluorouracil, 5-fluorouracil/calcipotriene, and/or imiquimod.  The purpose is to decrease the number of clinically evident and subclinical PreCancerous lesions to prevent progression to development of skin cancer by chemically destroying early precancer changes that may or may not be visible.  It has been shown to reduce the risk of developing skin cancer in the treated area. As a result of treatment, redness, scaling, crusting, and open sores may occur during treatment course. One or more than one of these methods may be used and may have to be used several times to control, suppress and eliminate the PreCancerous changes. Discussed treatment course, expected reaction, and possible side effects. - Recommend daily broad spectrum sunscreen SPF 30+ to sun-exposed areas, reapply every 2 hours as needed.  - Staying in the shade or wearing long sleeves, sun glasses (UVA+UVB protection) and wide brim hats (4-inch brim around the entire  circumference of the hat) are also recommended. - Call for new or changing lesions. - Start 5-fluorouracil  cream until red and irritated to affected areas including tops of ears, left forehead.  Reviewed course of treatment and expected reaction.  Patient advised to expect inflammation and crusting and advised that erosions are possible.  Patient advised to be diligent with sun protection during and after treatment. Handout with details of how to apply medication and what to expect provided. Counseled to keep medication out of reach of children and pets.   Lentigines, Seborrheic Keratoses, Hemangiomas - Benign normal skin lesions - Benign-appearing - Call for any changes - Patient advised hemangioma at chin could be treated with biopsy or laser.   Melanocytic Nevi - Tan-brown and/or pink-flesh-colored symmetric macules and papules - Benign appearing on exam today - Observation - Call clinic for new or changing moles - Recommend daily use of broad spectrum spf 30+ sunscreen to sun-exposed areas.    Return if symptoms worsen or fail to improve.  LILLETTE Lonell Drones, RMA, am acting as scribe for Boneta Sharps, MD .   Documentation: I have reviewed the above documentation for accuracy and completeness, and I agree with the above.  Boneta Sharps, MD
